# Patient Record
Sex: Female | Born: 1990 | Race: Black or African American | Hispanic: No | Marital: Single | Smoking: Former smoker
Health system: Southern US, Community
[De-identification: ages and names within clinical notes are randomized; demographics above are authoritative.]

## PROBLEM LIST (undated history)

## (undated) DIAGNOSIS — I10 Essential (primary) hypertension: Secondary | ICD-10-CM

## (undated) DIAGNOSIS — J45909 Unspecified asthma, uncomplicated: Secondary | ICD-10-CM

## (undated) DIAGNOSIS — F419 Anxiety disorder, unspecified: Secondary | ICD-10-CM

## (undated) DIAGNOSIS — A64 Unspecified sexually transmitted disease: Secondary | ICD-10-CM

## (undated) HISTORY — DX: Anxiety disorder, unspecified: F41.9

## (undated) HISTORY — DX: Unspecified sexually transmitted disease: A64

---
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
## 2011-03-13 LAB — POCT RAPID STREP A: Streptococcus, Group A Screen (Direct): NEGATIVE

## 2013-10-31 ENCOUNTER — Other Ambulatory Visit: Payer: Self-pay | Admitting: Women's Health

## 2013-11-02 ENCOUNTER — Other Ambulatory Visit (HOSPITAL_COMMUNITY)
Admission: RE | Admit: 2013-11-02 | Discharge: 2013-11-02 | Disposition: A | Discharge status: Home / Self Care | Payer: PRIVATE HEALTH INSURANCE | Source: Ambulatory Visit | Attending: Gynecology | Admitting: Gynecology

## 2013-11-02 ENCOUNTER — Encounter: Payer: Self-pay | Admitting: Women's Health

## 2013-11-02 ENCOUNTER — Ambulatory Visit (INDEPENDENT_AMBULATORY_CARE_PROVIDER_SITE_OTHER): Payer: PRIVATE HEALTH INSURANCE | Admitting: Women's Health

## 2013-11-02 VITALS — BP 120/78 | Ht 68.0 in | Wt 223.0 lb

## 2013-11-02 DIAGNOSIS — A5901 Trichomonal vulvovaginitis: Secondary | ICD-10-CM | POA: Insufficient documentation

## 2013-11-02 DIAGNOSIS — Z113 Encounter for screening for infections with a predominantly sexual mode of transmission: Secondary | ICD-10-CM

## 2013-11-02 DIAGNOSIS — A599 Trichomoniasis, unspecified: Secondary | ICD-10-CM

## 2013-11-02 DIAGNOSIS — A499 Bacterial infection, unspecified: Secondary | ICD-10-CM

## 2013-11-02 DIAGNOSIS — Z01419 Encounter for gynecological examination (general) (routine) without abnormal findings: Secondary | ICD-10-CM

## 2013-11-02 DIAGNOSIS — N760 Acute vaginitis: Secondary | ICD-10-CM

## 2013-11-02 DIAGNOSIS — E669 Obesity, unspecified: Secondary | ICD-10-CM | POA: Insufficient documentation

## 2013-11-02 DIAGNOSIS — B9689 Other specified bacterial agents as the cause of diseases classified elsewhere: Secondary | ICD-10-CM

## 2013-11-02 DIAGNOSIS — N898 Other specified noninflammatory disorders of vagina: Secondary | ICD-10-CM

## 2013-11-02 DIAGNOSIS — Z23 Encounter for immunization: Secondary | ICD-10-CM

## 2013-11-02 LAB — CBC WITH DIFFERENTIAL/PLATELET
Basophils Absolute: 0 10*3/uL (ref 0.0–0.1)
Basophils Relative: 0 % (ref 0–1)
Eosinophils Absolute: 0 10*3/uL (ref 0.0–0.7)
Eosinophils Relative: 1 % (ref 0–5)
HCT: 37.5 % (ref 36.0–46.0)
Hemoglobin: 12.6 g/dL (ref 12.0–15.0)
Lymphocytes Relative: 44 % (ref 12–46)
Lymphs Abs: 1.7 10*3/uL (ref 0.7–4.0)
MCH: 27.3 pg (ref 26.0–34.0)
MCHC: 33.6 g/dL (ref 30.0–36.0)
MCV: 81.3 fL (ref 78.0–100.0)
Monocytes Absolute: 0.4 10*3/uL (ref 0.1–1.0)
Monocytes Relative: 9 % (ref 3–12)
Neutro Abs: 1.8 10*3/uL (ref 1.7–7.7)
Neutrophils Relative %: 46 % (ref 43–77)
Platelets: 345 10*3/uL (ref 150–400)
RBC: 4.61 MIL/uL (ref 3.87–5.11)
RDW: 13.8 % (ref 11.5–15.5)
WBC: 3.9 10*3/uL — ABNORMAL LOW (ref 4.0–10.5)

## 2013-11-02 LAB — WET PREP FOR TRICH, YEAST, CLUE: Yeast Wet Prep HPF POC: NONE SEEN

## 2013-11-02 LAB — RPR

## 2013-11-02 MED ORDER — METRONIDAZOLE 500 MG PO TABS: ORAL_TABLET | ORAL | Status: DC

## 2013-11-02 NOTE — Patient Instructions (Signed)

## 2013-11-02 NOTE — Progress Notes (Signed)
Crystal Barton 02/16/1991 [id number]    History:    Presents for annual exam.   Monthly cycles, condoms for contraception, recent breakup with unfaithful partner. Not sexually active. Has not received gardasil. History of Chlamydia 2 years ago, had negative test of cure  Past medical history, past surgical history, family history and social history were all reviewed and documented in the EPIC chart. 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   ROS:  A  12 point ROS was performed and pertinent positives and negatives are included.  Exam:  Filed Vitals:   11/02/13 1119  BP: 120/78    General appearance:  Normal Thyroid:  Symmetrical, normal in size, without palpable masses or nodularity. Respiratory  Auscultation:  Clear without wheezing or rhonchi Cardiovascular  Auscultation:  Regular rate, without rubs, murmurs or gallops  Edema/varicosities:  Not grossly evident Abdominal  Soft,nontender, without masses, guarding or rebound.  Liver/spleen:  No organomegaly noted  Hernia:  None appreciated  Skin  Inspection:  Grossly normal   Breasts: Examined lying and sitting.     Right: Without masses, retractions, discharge or axillary adenopathy.     Left: Without masses, retractions, discharge or axillary adenopathy. Gentitourinary   Inguinal/mons:  Normal without inguinal adenopathy  External genitalia:  Normal  BUS/Urethra/Skene's glands:  Normal  Vagina:  Bloody discharge with odor. Wet prep positive for amines, Trichomonas, clues, TNTC bacteria  Cervix:  Normal  Uterus:   normal in size, shape and contour.  Midline and mobile  Adnexa/parametria:     Rt: Without masses or tenderness.   Lt: Without masses or tenderness.  Anus and perineum: Normal   Assessment/Plan:  22 y.o.SBF G0  for annual exam.     Trichomonas STD screening Contraception management Gardasil Obesity  Plan: Flagyl 2 g by mouth x1 dose, alcohol precautions reviewed, informed past partner for treatment.  Contraception options reviewed will try Nexplanon, information given and reviewed risk for irregular bleeding, will check coverage, return to office for Dr. Lily PeerFernandez to place with next cycle. Gardasil information given and reviewed, first given, return to office in 2 and 6 months to complete series. Reviewed importance of condoms if sexually active. SBE's, increase regular exercise and decrease calories for weight loss, MVI daily encouraged. CBC, UA, Pap, GC/Chlamydia, HIV, hep B, C., RPR.  Note: This dictation was prepared with Dragon/digital dictation.  Any transcriptional errors that result are unintentional. Harrington Challengerancy J Young Phys[facility] LLCWHNP, 12:00 PM 11/02/2013

## 2013-11-03 ENCOUNTER — Other Ambulatory Visit: Payer: Self-pay | Admitting: Women's Health

## 2013-11-03 LAB — HIV ANTIBODY (ROUTINE TESTING W REFLEX): HIV 1&2 Ab, 4th Generation: NONREACTIVE

## 2013-11-03 LAB — HEPATITIS B SURFACE ANTIGEN: Hepatitis B Surface Ag: NEGATIVE

## 2013-11-03 LAB — GC/CHLAMYDIA PROBE AMP
CT Probe RNA: POSITIVE — AB
GC Probe RNA: NEGATIVE

## 2013-11-03 LAB — HEPATITIS C ANTIBODY: HCV Ab: NEGATIVE

## 2013-11-03 MED ORDER — AZITHROMYCIN 500 MG PO TABS: 1000.0000 mg | ORAL_TABLET | Freq: Every day | ORAL | Status: DC

## 2013-11-24 ENCOUNTER — Encounter: Payer: Self-pay | Admitting: Women's Health

## 2013-11-24 ENCOUNTER — Telehealth: Payer: Self-pay | Admitting: Gynecology

## 2013-11-24 ENCOUNTER — Other Ambulatory Visit: Payer: Self-pay | Admitting: Women's Health

## 2013-11-24 ENCOUNTER — Telehealth: Payer: Self-pay | Admitting: Women's Health

## 2013-11-24 ENCOUNTER — Ambulatory Visit (INDEPENDENT_AMBULATORY_CARE_PROVIDER_SITE_OTHER): Payer: PRIVATE HEALTH INSURANCE | Admitting: Women's Health

## 2013-11-24 DIAGNOSIS — Z113 Encounter for screening for infections with a predominantly sexual mode of transmission: Secondary | ICD-10-CM

## 2013-11-24 DIAGNOSIS — IMO0001 Reserved for inherently not codable concepts without codable children: Secondary | ICD-10-CM

## 2013-11-24 MED ORDER — NORGESTIMATE-ETH ESTRADIOL 0.25-35 MG-MCG PO TABS: 1.0000 | ORAL_TABLET | Freq: Every day | ORAL | Status: DC

## 2013-11-24 NOTE — Patient Instructions (Signed)
Etonogestrel implant What is this medicine? ETONOGESTREL (et oh noe JES trel) is a contraceptive (birth control) device. It is used to prevent pregnancy. It can be used for up to 3 years. This medicine may be used for other purposes; ask your health care provider or pharmacist if you have questions. COMMON BRAND NAME(S): Implanon, Nexplanon  What should I tell my health care provider before I take this medicine? They need to know if you have any of these conditions: -abnormal vaginal bleeding -blood vessel disease or blood clots -cancer of the breast, cervix, or liver -depression -diabetes -gallbladder disease -headaches -heart disease or recent heart attack -high blood pressure -high cholesterol -kidney disease -liver disease -renal disease -seizures -tobacco smoker -an unusual or allergic reaction to etonogestrel, other hormones, anesthetics or antiseptics, medicines, foods, dyes, or preservatives -pregnant or trying to get pregnant -breast-feeding How should I use this medicine? This device is inserted just under the skin on the inner side of your upper arm by a health care professional. Talk to your pediatrician regarding the use of this medicine in children. Special care may be needed. Overdosage: If you think you've taken too much of this medicine contact a poison control center or emergency room at once. Overdosage: If you think you have taken too much of this medicine contact a poison control center or emergency room at once. NOTE: This medicine is only for you. Do not share this medicine with others. What if I miss a dose? This does not apply. What may interact with this medicine? Do not take this medicine with any of the following medications: -amprenavir -bosentan -fosamprenavir This medicine may also interact with the following medications: -barbiturate medicines for inducing sleep or treating seizures -certain medicines for fungal infections like ketoconazole and  itraconazole -griseofulvin -medicines to treat seizures like carbamazepine, felbamate, oxcarbazepine, phenytoin, topiramate -modafinil -phenylbutazone -rifampin -some medicines to treat HIV infection like atazanavir, indinavir, lopinavir, nelfinavir, tipranavir, ritonavir -St. John's wort This list may not describe all possible interactions. Give your health care provider a list of all the medicines, herbs, non-prescription drugs, or dietary supplements you use. Also tell them if you smoke, drink alcohol, or use illegal drugs. Some items may interact with your medicine. What should I watch for while using this medicine? This product does not protect you against HIV infection (AIDS) or other sexually transmitted diseases. You should be able to feel the implant by pressing your fingertips over the skin where it was inserted. Tell your doctor if you cannot feel the implant. What side effects may I notice from receiving this medicine? Side effects that you should report to your doctor or health care professional as soon as possible: -allergic reactions like skin rash, itching or hives, swelling of the face, lips, or tongue -breast lumps -changes in vision -confusion, trouble speaking or understanding -dark urine -depressed mood -general ill feeling or flu-like symptoms -light-colored stools -loss of appetite, nausea -right upper belly pain -severe headaches -severe pain, swelling, or tenderness in the abdomen -shortness of breath, chest pain, swelling in a leg -signs of pregnancy -sudden numbness or weakness of the face, arm or leg -trouble walking, dizziness, loss of balance or coordination -unusual vaginal bleeding, discharge -unusually weak or tired -yellowing of the eyes or skin Side effects that usually do not require medical attention (Report these to your doctor or health care professional if they continue or are bothersome.): -acne -breast pain -changes in  weight -cough -fever or chills -headache -irregular menstrual bleeding -itching, burning,   and vaginal discharge -pain or difficulty passing urine -sore throat This list may not describe all possible side effects. Call your doctor for medical advice about side effects. You may report side effects to FDA at 1-800-FDA-1088. Where should I keep my medicine? This drug is given in a hospital or clinic and will not be stored at home. NOTE: This sheet is a summary. It may not cover all possible information. If you have questions about this medicine, talk to your doctor, pharmacist, or health care provider.  2014, Elsevier/Gold Standard. (2011-12-08 15:37:45)  

## 2013-11-24 NOTE — Telephone Encounter (Signed)
Telephone call, reviewed options for contraception, will try a pill, Sprintec prescription, slight risk for blood clots and strokes, start up instructions and importance of condoms reviewed.

## 2013-11-24 NOTE — Telephone Encounter (Signed)
Message copied by Harrington Challenger on Thu Nov 24, 2013  5:58 PM ------      Message from: Carole Civil R      Created: Thu Nov 24, 2013  2:02 PM      Contact: [phone]       Nancy-I gave [name] the benefits and she can't afford it. She asked if you would call her and suggest another form of birth control. Thanks,Wendy ------

## 2013-11-24 NOTE — Progress Notes (Signed)
Patient ID: Crystal Barton, female   DOB: 06/22/1990, 23 y.o.   MRN: [id number]  Presents for test of cure for chlamydia diagnosed on 11/02/2013.  Partner informed. Has abstained since diagnosis. Trichomonas and BV also diagnosed on 5/20. Was compliant with medications and is currently asymptomatic. Denies spotting, unusual discharge. Negative pap, HIV, RPR, Hep B, C. monthly cycle,  Exam: well-appearing External: normal Speculum: normal,  unusual discharge, or odor, GC/Chlamydia culture taken  Test of cure Chlamydia  Contraception management  Plan: Reviewed importance of condom use, MVI encouraged, contraception options reviewed- will check Nexplanon coverage and Dr. Lily Peer will place during next cycle. Reviewed risks of irregular bleeding and weight gain.

## 2013-11-24 NOTE — Telephone Encounter (Signed)
11/24/13-Pt was advised that her [employer] covers the insertion of Nexplanon with 20% coinsurance once $900.00 deductible met. She has not met the deductible at all. Her out of pocket cost would be $1044.40 which she cannot afford. [place] to call pt with other contraceptive options/wl

## 2013-11-25 LAB — GC/CHLAMYDIA PROBE AMP
CT Probe RNA: NEGATIVE
GC Probe RNA: NEGATIVE

## 2014-04-17 ENCOUNTER — Encounter: Payer: Self-pay | Admitting: Women's Health

## 2014-04-20 ENCOUNTER — Encounter: Payer: Self-pay | Admitting: Women's Health

## 2014-04-20 ENCOUNTER — Ambulatory Visit (INDEPENDENT_AMBULATORY_CARE_PROVIDER_SITE_OTHER): Payer: PRIVATE HEALTH INSURANCE | Admitting: Women's Health

## 2014-04-20 VITALS — BP 136/94 | Ht 68.0 in | Wt 222.0 lb

## 2014-04-20 DIAGNOSIS — Z113 Encounter for screening for infections with a predominantly sexual mode of transmission: Secondary | ICD-10-CM

## 2014-04-20 NOTE — Progress Notes (Signed)
Presents for STD screen. New partner 2 months. Condoms every time. Reports condoms have broke. Positive for Chlamydia in May with negative TOC. LMP Oct 10. Stopped taking sprintec in August due to nausea and "not feeling right". Denies vaginal discharge/odor, urinary symptoms, or abdominal pain.   Exam: Well appearing. External genitalia WNLs. Speculum exam with scant discharge. No CMT or adnexal fullness. GC/Chlamydia culture taken  STD Screen Contraceptive Counseling  Plan: Reviewed contraception options-declined. Discussed importance of condoms every time until permanent partner. HIV, RPR, Hep B&C, CG/Chlamydia. Second gardasil given, return to office in 4 months for third and final.

## 2014-04-21 LAB — GC/CHLAMYDIA PROBE AMP
CT PROBE, AMP APTIMA: NEGATIVE
GC Probe RNA: NEGATIVE

## 2014-04-21 LAB — HEPATITIS B SURFACE ANTIGEN: Hepatitis B Surface Ag: NEGATIVE

## 2014-04-21 LAB — HIV ANTIBODY (ROUTINE TESTING W REFLEX): HIV 1&2 Ab, 4th Generation: NONREACTIVE

## 2014-04-21 LAB — RPR

## 2014-04-21 LAB — HEPATITIS C ANTIBODY: HCV AB: NEGATIVE

## 2014-11-09 ENCOUNTER — Ambulatory Visit (INDEPENDENT_AMBULATORY_CARE_PROVIDER_SITE_OTHER): Payer: PRIVATE HEALTH INSURANCE | Admitting: Women's Health

## 2014-11-09 ENCOUNTER — Encounter: Payer: Self-pay | Admitting: Women's Health

## 2014-11-09 VITALS — BP 116/72 | Ht 68.0 in | Wt 240.0 lb

## 2014-11-09 DIAGNOSIS — A499 Bacterial infection, unspecified: Secondary | ICD-10-CM | POA: Diagnosis not present

## 2014-11-09 DIAGNOSIS — N760 Acute vaginitis: Secondary | ICD-10-CM

## 2014-11-09 DIAGNOSIS — B373 Candidiasis of vulva and vagina: Secondary | ICD-10-CM | POA: Diagnosis not present

## 2014-11-09 DIAGNOSIS — N898 Other specified noninflammatory disorders of vagina: Secondary | ICD-10-CM | POA: Diagnosis not present

## 2014-11-09 DIAGNOSIS — Z01419 Encounter for gynecological examination (general) (routine) without abnormal findings: Secondary | ICD-10-CM

## 2014-11-09 DIAGNOSIS — B9689 Other specified bacterial agents as the cause of diseases classified elsewhere: Secondary | ICD-10-CM

## 2014-11-09 DIAGNOSIS — B3731 Acute candidiasis of vulva and vagina: Secondary | ICD-10-CM

## 2014-11-09 DIAGNOSIS — Z23 Encounter for immunization: Secondary | ICD-10-CM | POA: Diagnosis not present

## 2014-11-09 LAB — CBC WITH DIFFERENTIAL/PLATELET
Basophils Absolute: 0 10*3/uL (ref 0.0–0.1)
Basophils Relative: 0 % (ref 0–1)
EOS PCT: 2 % (ref 0–5)
Eosinophils Absolute: 0.1 10*3/uL (ref 0.0–0.7)
HCT: 40.5 % (ref 36.0–46.0)
HEMOGLOBIN: 13.5 g/dL (ref 12.0–15.0)
Lymphocytes Relative: 44 % (ref 12–46)
Lymphs Abs: 2.8 10*3/uL (ref 0.7–4.0)
MCH: 28.4 pg (ref 26.0–34.0)
MCHC: 33.3 g/dL (ref 30.0–36.0)
MCV: 85.1 fL (ref 78.0–100.0)
MONO ABS: 0.6 10*3/uL (ref 0.1–1.0)
MONOS PCT: 9 % (ref 3–12)
MPV: 9.4 fL (ref 8.6–12.4)
Neutro Abs: 2.9 10*3/uL (ref 1.7–7.7)
Neutrophils Relative %: 45 % (ref 43–77)
Platelets: 318 10*3/uL (ref 150–400)
RBC: 4.76 MIL/uL (ref 3.87–5.11)
RDW: 13.5 % (ref 11.5–15.5)
WBC: 6.4 10*3/uL (ref 4.0–10.5)

## 2014-11-09 LAB — WET PREP FOR TRICH, YEAST, CLUE: TRICH WET PREP: NONE SEEN

## 2014-11-09 MED ORDER — METRONIDAZOLE 500 MG PO TABS: 500.0000 mg | ORAL_TABLET | Freq: Two times a day (BID) | ORAL | Status: DC

## 2014-11-09 MED ORDER — FLUCONAZOLE 150 MG PO TABS: 150.0000 mg | ORAL_TABLET | Freq: Once | ORAL | Status: DC

## 2014-11-09 NOTE — Addendum Note (Signed)
Addended by: Dayna BarkerGARDNER, Shrihaan Porzio K on: 11/09/2014 04:03 PM   Modules accepted: Orders

## 2014-11-09 NOTE — Patient Instructions (Signed)

## 2014-11-09 NOTE — Addendum Note (Signed)
Addended by: Dayna BarkerGARDNER, KIMBERLY K on: 11/09/2014 10:37 AM   Modules accepted: Orders

## 2014-11-09 NOTE — Addendum Note (Signed)
Addended by: Rushie GoltzSPANGLER, Candid Bovey on: 11/09/2014 02:02 PM   Modules accepted: Orders

## 2014-11-09 NOTE — Addendum Note (Signed)
Addended by: Rushie GoltzSPANGLER, Printice Hellmer on: 11/09/2014 10:33 AM   Modules accepted: Orders

## 2014-11-09 NOTE — Progress Notes (Signed)
Crystal Barton 01/26/1991 [id number]    History:    Presents for annual exam.  Regular monthly cycle/condoms/same partner. Negative STD screen 04/2014. Received 1 gardasil. Smokes one light cigar daily.  Past medical history, past surgical history, family history and social history were all reviewed and documented in the EPIC chart. 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  ROS:  A ROS was performed and pertinent positives and negatives are included.  Exam:  Filed Vitals:   11/09/14 0858  BP: 116/72    General appearance:  Normal Thyroid:  Symmetrical, normal in size, without palpable masses or nodularity. Respiratory  Auscultation:  Clear without wheezing or rhonchi Cardiovascular  Auscultation:  Regular rate, without rubs, murmurs or gallops  Edema/varicosities:  Not grossly evident Abdominal  Soft,nontender, without masses, guarding or rebound.  Liver/spleen:  No organomegaly noted  Hernia:  None appreciated  Skin  Inspection:  Grossly normal   Breasts: Examined lying and sitting.     Right: Without masses, retractions, discharge or axillary adenopathy.     Left: Without masses, retractions, discharge or axillary adenopathy. Gentitourinary   Inguinal/mons:  Normal without inguinal adenopathy  External genitalia:  Normal  BUS/Urethra/Skene's glands:  Normal  Vagina:  Moderate discharge with odor, wet prep positive for few yeast, amines, clues, TNTC bacteria  Cervix:  Normal  Uterus:   normal in size, shape and contour.  Midline and mobile  Adnexa/parametria:     Rt: Without masses or tenderness.   Lt: Without masses or tenderness.  Anus and perineum: Normal    Assessment/Plan:  24 y.o. SBF G0  for annual exam with no complaints.  Monthly cycle/condoms Bacteria vaginosis Yeast Obesity  Plan: Contraception options reviewed, would like Nexplanon not covered by insurance, will check with [facility] or health department for placement. Condoms in the  meanwhile declines OCPs. SBE's, exercise, calcium rich diet, MVI daily, decreasing calories for weight loss encouraged. Reviewed importance of no smoking. CBC, glucose, UA, Pap normal 2015, new screening guidelines reviewed. Flagyl 500 twice daily for 7 days, alcohol precautions reviewed, Diflucan 150 by mouth times one dose with refill. Declines STD screen. Instructed to call if no relief of discharge. Second gardasil given, instructed to return to office in 4 months for third .   Harrington ChallengerYOUNG,NANCY J [facility] CenterWHNP, 9:34 AM 11/09/2014

## 2014-11-10 LAB — URINALYSIS W MICROSCOPIC + REFLEX CULTURE
BILIRUBIN URINE: NEGATIVE
Casts: NONE SEEN
Crystals: NONE SEEN
Glucose, UA: NEGATIVE mg/dL
Hgb urine dipstick: NEGATIVE
KETONES UR: NEGATIVE mg/dL
Nitrite: NEGATIVE
PROTEIN: NEGATIVE mg/dL
Specific Gravity, Urine: 1.027 (ref 1.005–1.030)
Urobilinogen, UA: 0.2 mg/dL (ref 0.0–1.0)
pH: 5.5 (ref 5.0–8.0)

## 2014-11-10 LAB — GLUCOSE, RANDOM: Glucose, Bld: 84 mg/dL (ref 70–99)

## 2014-11-11 LAB — URINE CULTURE: Colony Count: >=100000

## 2015-01-30 ENCOUNTER — Encounter: Payer: Self-pay | Admitting: Women's Health

## 2015-01-30 ENCOUNTER — Ambulatory Visit (INDEPENDENT_AMBULATORY_CARE_PROVIDER_SITE_OTHER): Payer: PRIVATE HEALTH INSURANCE | Admitting: Women's Health

## 2015-01-30 VITALS — BP 124/80 | Ht 68.0 in | Wt 240.0 lb

## 2015-01-30 DIAGNOSIS — Z23 Encounter for immunization: Secondary | ICD-10-CM | POA: Diagnosis not present

## 2015-01-30 DIAGNOSIS — R350 Frequency of micturition: Secondary | ICD-10-CM | POA: Diagnosis not present

## 2015-01-30 DIAGNOSIS — N898 Other specified noninflammatory disorders of vagina: Secondary | ICD-10-CM

## 2015-01-30 DIAGNOSIS — B373 Candidiasis of vulva and vagina: Secondary | ICD-10-CM | POA: Diagnosis not present

## 2015-01-30 DIAGNOSIS — B3731 Acute candidiasis of vulva and vagina: Secondary | ICD-10-CM

## 2015-01-30 LAB — WET PREP FOR TRICH, YEAST, CLUE
CLUE CELLS WET PREP: NONE SEEN
Trich, Wet Prep: NONE SEEN

## 2015-01-30 LAB — URINALYSIS W MICROSCOPIC + REFLEX CULTURE
Bilirubin Urine: NEGATIVE
Crystals: NONE SEEN [HPF]
Glucose, UA: NEGATIVE
Hgb urine dipstick: NEGATIVE
Ketones, ur: NEGATIVE
Nitrite: NEGATIVE
PROTEIN: NEGATIVE
RBC / HPF: NONE SEEN RBC/HPF (ref <=2)
Specific Gravity, Urine: 1.02 (ref 1.001–1.035)
pH: 7 (ref 5.0–8.0)

## 2015-01-30 MED ORDER — FLUCONAZOLE 150 MG PO TABS: 150.0000 mg | ORAL_TABLET | Freq: Once | ORAL | Status: DC

## 2015-01-30 NOTE — Patient Instructions (Signed)

## 2015-01-30 NOTE — Progress Notes (Signed)
Patient ID: Crystal Barton, female   DOB: 11-28-1990, 24 y.o.   MRN: [id number] Resents with complaint of vaginal discharge with itching and increased urinary frequency for the past week. Has had several yeast infections in the past year. Past partner unfaithful, reports condom use consistently. Monthly cycle. Denies abdominal pain, fever or pain or burning at end of stream of urination.  Exam: Appears well. External genitalia mild erythema, speculum exam scant white discharge, GC/Chlamydia culture taken. Wet prep positive for yeast. Bimanual no CMT or adnexal tenderness. UA: Trace leukocytes, 0-5 WBCs, few bacteria.  Yeast vaginitis  Plan: Diflucan 150 by mouth today and repeat in 3 days if needed. Yeast prevention discussed. Instructed to call if continued problems or if no relief.  GC/Chlamydia culture pending. Denies need for HIV, hepatitis or RPR. Contraception options reviewed and denies need will continue abstinence or if active will use condoms. Urine culture pending. Third gardasil given.

## 2015-01-31 LAB — GC/CHLAMYDIA PROBE AMP
CT Probe RNA: NEGATIVE
GC Probe RNA: NEGATIVE

## 2015-01-31 LAB — URINE CULTURE

## 2015-03-07 ENCOUNTER — Ambulatory Visit (INDEPENDENT_AMBULATORY_CARE_PROVIDER_SITE_OTHER): Payer: PRIVATE HEALTH INSURANCE | Admitting: Women's Health

## 2015-03-07 ENCOUNTER — Encounter: Payer: Self-pay | Admitting: Women's Health

## 2015-03-07 VITALS — BP 124/80 | Ht 68.0 in | Wt 242.0 lb

## 2015-03-07 DIAGNOSIS — N898 Other specified noninflammatory disorders of vagina: Secondary | ICD-10-CM

## 2015-03-07 DIAGNOSIS — B373 Candidiasis of vulva and vagina: Secondary | ICD-10-CM | POA: Diagnosis not present

## 2015-03-07 DIAGNOSIS — N760 Acute vaginitis: Secondary | ICD-10-CM | POA: Diagnosis not present

## 2015-03-07 DIAGNOSIS — A499 Bacterial infection, unspecified: Secondary | ICD-10-CM | POA: Diagnosis not present

## 2015-03-07 DIAGNOSIS — B3731 Acute candidiasis of vulva and vagina: Secondary | ICD-10-CM

## 2015-03-07 DIAGNOSIS — B9689 Other specified bacterial agents as the cause of diseases classified elsewhere: Secondary | ICD-10-CM

## 2015-03-07 LAB — WET PREP FOR TRICH, YEAST, CLUE: Trich, Wet Prep: NONE SEEN

## 2015-03-07 MED ORDER — FLUCONAZOLE 150 MG PO TABS: 150.0000 mg | ORAL_TABLET | Freq: Once | ORAL | Status: DC

## 2015-03-07 MED ORDER — METRONIDAZOLE 500 MG PO TABS: 500.0000 mg | ORAL_TABLET | Freq: Two times a day (BID) | ORAL | Status: DC

## 2015-03-07 NOTE — Progress Notes (Signed)
Patient ID: Crystal Barton, female   DOB: February 09, 1991, 24 y.o.   MRN: [id number] Presents with complaint of increased vaginal discharge with odor, requesting STD screen/ partner unfaithful. Regular monthly cycle/condoms consistently. Denies abdominal pain, urinary symptoms, fever.  Exam: Appears well. Abdomen soft nontender. External genitalia within normal limits, speculum exam moderate amount of a white adherent discharge noted wet prep positive for yeast, clues, TNTC bacteria. GC/Chlamydia culture taken. Bimanual no CMT or adnexal tenderness.  Yeast vaginitis Bacteria vaginosis STD screen  Plan: Flagyl 500 twice daily for 7 days, alcohol precautions reviewed. Diflucan 150 by mouth times one dose with refill. Instructed to call if no relief. GC/immediate culture pending. Declined HIV, hep B, C, RPR. Reviewed importance of condoms if sexually active. Contraception options reviewed and declined.

## 2015-03-07 NOTE — Patient Instructions (Signed)
Monilial Vaginitis Vaginitis in a soreness, swelling and redness (inflammation) of the vagina and vulva. Monilial vaginitis is not a sexually transmitted infection. CAUSES  Yeast vaginitis is caused by yeast (candida) that is normally found in your vagina. With a yeast infection, the candida has overgrown in number to a point that upsets the chemical balance. SYMPTOMS   White, thick vaginal discharge.  Swelling, itching, redness and irritation of the vagina and possibly the lips of the vagina (vulva).  Burning or painful urination.  Painful intercourse. DIAGNOSIS  Things that may contribute to monilial vaginitis are:  Postmenopausal and virginal states.  Pregnancy.  Infections.  Being tired, sick or stressed, especially if you had monilial vaginitis in the past.  Diabetes. Good control will help lower the chance.  Birth control pills.  Tight fitting garments.  Using bubble bath, feminine sprays, douches or deodorant tampons.  Taking certain medications that kill germs (antibiotics).  Sporadic recurrence can occur if you become ill. TREATMENT  Your caregiver will give you medication.  There are several kinds of anti monilial vaginal creams and suppositories specific for monilial vaginitis. For recurrent yeast infections, use a suppository or cream in the vagina 2 times a week, or as directed.  Anti-monilial or steroid cream for the itching or irritation of the vulva may also be used. Get your caregiver's permission.  Painting the vagina with methylene blue solution may help if the monilial cream does not work.  Eating yogurt may help prevent monilial vaginitis. HOME CARE INSTRUCTIONS   Finish all medication as prescribed.  Do not have sex until treatment is completed or after your caregiver tells you it is okay.  Take warm sitz baths.  Do not douche.  Do not use tampons, especially scented ones.  Wear cotton underwear.  Avoid tight pants and panty  hose.  Tell your sexual partner that you have a yeast infection. They should go to their caregiver if they have symptoms such as mild rash or itching.  Your sexual partner should be treated as well if your infection is difficult to eliminate.  Practice safer sex. Use condoms.  Some vaginal medications cause latex condoms to fail. Vaginal medications that harm condoms are:  Cleocin cream.  Butoconazole (Femstat).  Terconazole (Terazol) vaginal suppository.  Miconazole (Monistat) (may be purchased over the counter). SEEK MEDICAL CARE IF:   You have a temperature by mouth above 102 F (38.9 C).  The infection is getting worse after 2 days of treatment.  The infection is not getting better after 3 days of treatment.  You develop blisters in or around your vagina.  You develop vaginal bleeding, and it is not your menstrual period.  You have pain when you urinate.  You develop intestinal problems.  You have pain with sexual intercourse. Document Released: 03/12/2005 Document Revised: 08/25/2011 Document Reviewed: 11/24/2008 ExitCare Patient Information 2015 [employer]. This information is not intended to replace advice given to you by your health care provider. Make sure you discuss any questions you have with your health care provider. Bacterial Vaginosis Bacterial vaginosis is an infection of the vagina. It happens when too many of certain germs (bacteria) grow in the vagina. HOME CARE  Take your medicine as told by your doctor.  Finish your medicine even if you start to feel better.  Do not have sex until you finish your medicine and are better.  Tell your sex partner that you have an infection. They should see their doctor for treatment.  Practice safe sex.   Use condoms. Have only one sex partner. GET HELP IF:  You are not getting better after 3 days of treatment.  You have more grey fluid (discharge) coming from your vagina than before.  You have more  pain than before.  You have a fever. MAKE SURE YOU:   Understand these instructions.  Will watch your condition.  Will get help right away if you are not doing well or get worse. Document Released: 03/11/2008 Document Revised: 03/23/2013 Document Reviewed: 01/12/2013 ExitCare Patient Information 2015 [employer]. This information is not intended to replace advice given to you by your health care provider. Make sure you discuss any questions you have with your health care provider.  

## 2015-03-08 LAB — GC/CHLAMYDIA PROBE AMP
CT Probe RNA: NEGATIVE
GC Probe RNA: NEGATIVE

## 2015-11-27 ENCOUNTER — Encounter: Payer: PRIVATE HEALTH INSURANCE | Admitting: Women's Health

## 2015-11-28 ENCOUNTER — Encounter: Payer: PRIVATE HEALTH INSURANCE | Admitting: Women's Health

## 2015-12-12 ENCOUNTER — Ambulatory Visit (INDEPENDENT_AMBULATORY_CARE_PROVIDER_SITE_OTHER): Payer: PRIVATE HEALTH INSURANCE | Admitting: Women's Health

## 2015-12-12 ENCOUNTER — Encounter: Payer: Self-pay | Admitting: Women's Health

## 2015-12-12 VITALS — BP 122/78 | Wt 252.0 lb

## 2015-12-12 DIAGNOSIS — L089 Local infection of the skin and subcutaneous tissue, unspecified: Secondary | ICD-10-CM

## 2015-12-12 DIAGNOSIS — Z01419 Encounter for gynecological examination (general) (routine) without abnormal findings: Secondary | ICD-10-CM

## 2015-12-12 DIAGNOSIS — N946 Dysmenorrhea, unspecified: Secondary | ICD-10-CM

## 2015-12-12 DIAGNOSIS — Z113 Encounter for screening for infections with a predominantly sexual mode of transmission: Secondary | ICD-10-CM

## 2015-12-12 LAB — CBC WITH DIFFERENTIAL/PLATELET
Basophils Absolute: 0 cells/uL (ref 0–200)
Basophils Relative: 0 %
EOS PCT: 1 %
Eosinophils Absolute: 54 cells/uL (ref 15–500)
HCT: 38.6 % (ref 35.0–45.0)
HEMOGLOBIN: 12.8 g/dL (ref 11.7–15.5)
LYMPHS ABS: 2052 {cells}/uL (ref 850–3900)
Lymphocytes Relative: 38 %
MCH: 27.4 pg (ref 27.0–33.0)
MCHC: 33.2 g/dL (ref 32.0–36.0)
MCV: 82.7 fL (ref 80.0–100.0)
MONOS PCT: 7 %
MPV: 10.5 fL (ref 7.5–12.5)
Monocytes Absolute: 378 cells/uL (ref 200–950)
NEUTROS ABS: 2916 {cells}/uL (ref 1500–7800)
NEUTROS PCT: 54 %
PLATELETS: 360 10*3/uL (ref 140–400)
RBC: 4.67 MIL/uL (ref 3.80–5.10)
RDW: 13.6 % (ref 11.0–15.0)
WBC: 5.4 10*3/uL (ref 3.8–10.8)

## 2015-12-12 MED ORDER — CEPHALEXIN 500 MG PO CAPS: 500.0000 mg | ORAL_CAPSULE | Freq: Four times a day (QID) | ORAL | Status: DC

## 2015-12-12 MED ORDER — FLUCONAZOLE 150 MG PO TABS: 150.0000 mg | ORAL_TABLET | Freq: Once | ORAL | Status: DC

## 2015-12-12 MED ORDER — IBUPROFEN 600 MG PO TABS: 600.0000 mg | ORAL_TABLET | Freq: Three times a day (TID) | ORAL | Status: DC | PRN

## 2015-12-12 NOTE — Progress Notes (Signed)
Crystal Barton 02/27/1991 [id number]   History:    Presents for annual exam with complaint of increased cramping during periods and persistent skin lesion under left arm.  Monthly 7day cycles.  New partner, condoms.  Gardasil series completed.  Negative Pap 2015.  Reports weight gain, recently started walking for exercise.  Eats fruits and vegetables regularly, trying to eliminate sugary drinks from diet.  Past medical history, past surgical history, family history and social history were all reviewed and documented in the EPIC chart.  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  Quit smoking cigars within past year.    ROS:  A ROS was performed and pertinent positives and negatives are included.  Exam:  Filed Vitals:   12/12/15 1100  BP: 122/78    General appearance:  Normal Thyroid:  Symmetrical, normal in size, without palpable masses or nodularity. Respiratory  Auscultation:  Clear without wheezing or rhonchi Cardiovascular  Auscultation:  Regular rate, without rubs, murmurs or gallops  Edema/varicosities:  Not grossly evident Abdominal  Soft,nontender, without masses, guarding or rebound.  Liver/spleen:  No organomegaly noted  Hernia:  None appreciated  Skin  Inspection:  Grossly normal.  Raised red lesion left axilla.   Breasts: Examined lying and sitting.     Right: Without masses, retractions, discharge or axillary adenopathy.     Left: Without masses, retractions, discharge or axillary adenopathy. Gentitourinary   Inguinal/mons:  Normal without inguinal adenopathy  External genitalia:  Normal  BUS/Urethra/Skene's glands:  Normal  Vagina:  Normal, no erythema or discharge present.  Cervix:  Normal  Uterus:  Normal in size, shape and contour.  Midline and mobile  Adnexa/parametria:     Rt: Without masses or tenderness.   Lt: Without masses or tenderness.  Anus and perineum: Normal  Digital rectal exam: Normal sphincter tone without palpated masses or  tenderness  Assessment/Plan:  25 y.o. SBF G2P0020  for annual exam with complaints of increased cramping during monthly cycles and skin lesion under left arm.  Monthly cycles with dysmenorrhea Obesity Skin infection left axilla STD Screen  Plan: Transvaginal ultrasound to rule out fibroids.  Prescribed Motrin 600mg  po q8hrs prn for relief of menstrual cramps.  Encouraged healthy diet and regular exercise, discussed gym membership.  Keflex 500mg  QID x5 days prescribed to treat skin lesion. Instructed to call if no relief  Diflucan 150mg  x1dose if yeast infection occurs as result of antibiotics.  Continue condoms, patient declines other contraception. SBE's, continue to increase regular exercise, calcium rich diet, MVI daily. CBC, CMP, UA, GC/Chl, Hep C, Hep B, RPR, HIV.   Harrington ChallengerYOUNG,NANCY J WHNP, 11:39 AM 12/12/2015

## 2015-12-12 NOTE — Patient Instructions (Signed)
Health Maintenance, Female Adopting a healthy lifestyle and getting preventive care can go a long way to promote health and wellness. Talk with your health care provider about what schedule of regular examinations is right for you. This is a good chance for you to check in with your provider about disease prevention and staying healthy. In between checkups, there are plenty of things you can do on your own. Experts have done a lot of research about which lifestyle changes and preventive measures are most likely to keep you healthy. Ask your health care provider for more information. WEIGHT AND DIET  Eat a healthy diet  Be sure to include plenty of vegetables, fruits, low-fat dairy products, and lean protein.  Do not eat a lot of foods high in solid fats, added sugars, or salt.  Get regular exercise. This is one of the most important things you can do for your health.  Most adults should exercise for at least 150 minutes each week. The exercise should increase your heart rate and make you sweat (moderate-intensity exercise).  Most adults should also do strengthening exercises at least twice a week. This is in addition to the moderate-intensity exercise.  Maintain a healthy weight  Body mass index (BMI) is a measurement that can be used to identify possible weight problems. It estimates body fat based on height and weight. Your health care provider can help determine your BMI and help you achieve or maintain a healthy weight.  For females 20 years of age and older:   A BMI below 18.5 is considered underweight.  A BMI of 18.5 to 24.9 is normal.  A BMI of 25 to 29.9 is considered overweight.  A BMI of 30 and above is considered obese.  Watch levels of cholesterol and blood lipids  You should start having your blood tested for lipids and cholesterol at 25 years of age, then have this test every 5 years.  You may need to have your cholesterol levels checked more often if:  Your lipid  or cholesterol levels are high.  You are older than 25 years of age.  You are at high risk for heart disease.  CANCER SCREENING   Lung Cancer  Lung cancer screening is recommended for adults 55-80 years old who are at high risk for lung cancer because of a history of smoking.  A yearly low-dose CT scan of the lungs is recommended for people who:  Currently smoke.  Have quit within the past 15 years.  Have at least a 30-pack-year history of smoking. A pack year is smoking an average of one pack of cigarettes a day for 1 year.  Yearly screening should continue until it has been 15 years since you quit.  Yearly screening should stop if you develop a health problem that would prevent you from having lung cancer treatment.  Breast Cancer  Practice breast self-awareness. This means understanding how your breasts normally appear and feel.  It also means doing regular breast self-exams. Let your health care provider know about any changes, no matter how small.  If you are in your 20s or 30s, you should have a clinical breast exam (CBE) by a health care provider every 1-3 years as part of a regular health exam.  If you are 40 or older, have a CBE every year. Also consider having a breast X-ray (mammogram) every year.  If you have a family history of breast cancer, talk to your health care provider about genetic screening.  If you   are at high risk for breast cancer, talk to your health care provider about having an MRI and a mammogram every year.  Breast cancer gene (BRCA) assessment is recommended for women who have family members with BRCA-related cancers. BRCA-related cancers include:  Breast.  Ovarian.  Tubal.  Peritoneal cancers.  Results of the assessment will determine the need for genetic counseling and BRCA1 and BRCA2 testing. Cervical Cancer Your health care provider may recommend that you be screened regularly for cancer of the pelvic organs (ovaries, uterus, and  vagina). This screening involves a pelvic examination, including checking for microscopic changes to the surface of your cervix (Pap test). You may be encouraged to have this screening done every 3 years, beginning at age 21.  For women ages 30-65, health care providers may recommend pelvic exams and Pap testing every 3 years, or they may recommend the Pap and pelvic exam, combined with testing for human papilloma virus (HPV), every 5 years. Some types of HPV increase your risk of cervical cancer. Testing for HPV may also be done on women of any age with unclear Pap test results.  Other health care providers may not recommend any screening for nonpregnant women who are considered low risk for pelvic cancer and who do not have symptoms. Ask your health care provider if a screening pelvic exam is right for you.  If you have had past treatment for cervical cancer or a condition that could lead to cancer, you need Pap tests and screening for cancer for at least 20 years after your treatment. If Pap tests have been discontinued, your risk factors (such as having a new sexual partner) need to be reassessed to determine if screening should resume. Some women have medical problems that increase the chance of getting cervical cancer. In these cases, your health care provider may recommend more frequent screening and Pap tests. Colorectal Cancer  This type of cancer can be detected and often prevented.  Routine colorectal cancer screening usually begins at 25 years of age and continues through 25 years of age.  Your health care provider may recommend screening at an earlier age if you have risk factors for colon cancer.  Your health care provider may also recommend using home test kits to check for hidden blood in the stool.  A small camera at the end of a tube can be used to examine your colon directly (sigmoidoscopy or colonoscopy). This is done to check for the earliest forms of colorectal  cancer.  Routine screening usually begins at age 50.  Direct examination of the colon should be repeated every 5-10 years through 25 years of age. However, you may need to be screened more often if early forms of precancerous polyps or small growths are found. Skin Cancer  Check your skin from head to toe regularly.  Tell your health care provider about any new moles or changes in moles, especially if there is a change in a mole's shape or color.  Also tell your health care provider if you have a mole that is larger than the size of a pencil eraser.  Always use sunscreen. Apply sunscreen liberally and repeatedly throughout the day.  Protect yourself by wearing long sleeves, pants, a wide-brimmed hat, and sunglasses whenever you are outside. HEART DISEASE, DIABETES, AND HIGH BLOOD PRESSURE   High blood pressure causes heart disease and increases the risk of stroke. High blood pressure is more likely to develop in:  People who have blood pressure in the high end   of the normal range (130-139/85-89 mm Hg).  People who are overweight or obese.  People who are African American.  If you are 38-23 years of age, have your blood pressure checked every 3-5 years. If you are 61 years of age or older, have your blood pressure checked every year. You should have your blood pressure measured twice--once when you are at a hospital or clinic, and once when you are not at a hospital or clinic. Record the average of the two measurements. To check your blood pressure when you are not at a hospital or clinic, you can use:  An automated blood pressure machine at a pharmacy.  A home blood pressure monitor.  If you are between 45 years and 39 years old, ask your health care provider if you should take aspirin to prevent strokes.  Have regular diabetes screenings. This involves taking a blood sample to check your fasting blood sugar level.  If you are at a normal weight and have a low risk for diabetes,  have this test once every three years after 25 years of age.  If you are overweight and have a high risk for diabetes, consider being tested at a younger age or more often. PREVENTING INFECTION  Hepatitis B  If you have a higher risk for hepatitis B, you should be screened for this virus. You are considered at high risk for hepatitis B if:  You were born in a country where hepatitis B is common. Ask your health care provider which countries are considered high risk.  Your parents were born in a high-risk country, and you have not been immunized against hepatitis B (hepatitis B vaccine).  You have HIV or AIDS.  You use needles to inject street drugs.  You live with someone who has hepatitis B.  You have had sex with someone who has hepatitis B.  You get hemodialysis treatment.  You take certain medicines for conditions, including cancer, organ transplantation, and autoimmune conditions. Hepatitis C  Blood testing is recommended for:  Everyone born from [birth year] through 1965.  Anyone with known risk factors for hepatitis C. Sexually transmitted infections (STIs)  You should be screened for sexually transmitted infections (STIs) including gonorrhea and chlamydia if:  You are sexually active and are younger than 24 years of age.  You are older than 25 years of age and your health care provider tells you that you are at risk for this type of infection.  Your sexual activity has changed since you were last screened and you are at an increased risk for chlamydia or gonorrhea. Ask your health care provider if you are at risk.  If you do not have HIV, but are at risk, it may be recommended that you take a prescription medicine daily to prevent HIV infection. This is called pre-exposure prophylaxis (PrEP). You are considered at risk if:  You are sexually active and do not regularly use condoms or know the HIV status of your partner(s).  You take drugs by injection.  You are sexually  active with a partner who has HIV. Talk with your health care provider about whether you are at high risk of being infected with HIV. If you choose to begin PrEP, you should first be tested for HIV. You should then be tested every 3 months for as long as you are taking PrEP.  PREGNANCY   If you are premenopausal and you may become pregnant, ask your health care provider about preconception counseling.  If you may  become pregnant, take 400 to 800 micrograms (mcg) of folic acid every day.  If you want to prevent pregnancy, talk to your health care provider about birth control (contraception). OSTEOPOROSIS AND MENOPAUSE   Osteoporosis is a disease in which the bones lose minerals and strength with aging. This can result in serious bone fractures. Your risk for osteoporosis can be identified using a bone density scan.  If you are 61 years of age or older, or if you are at risk for osteoporosis and fractures, ask your health care provider if you should be screened.  Ask your health care provider whether you should take a calcium or vitamin D supplement to lower your risk for osteoporosis.  Menopause may have certain physical symptoms and risks.  Hormone replacement therapy may reduce some of these symptoms and risks. Talk to your health care provider about whether hormone replacement therapy is right for you.  HOME CARE INSTRUCTIONS   Schedule regular health, dental, and eye exams.  Stay current with your immunizations.   Do not use any tobacco products including cigarettes, chewing tobacco, or electronic cigarettes.  If you are pregnant, do not drink alcohol.  If you are breastfeeding, limit how much and how often you drink alcohol.  Limit alcohol intake to no more than 1 drink per day for nonpregnant women. One drink equals 12 ounces of beer, 5 ounces of wine, or 1 ounces of hard liquor.  Do not use street drugs.  Do not share needles.  Ask your health care provider for help if  you need support or information about quitting drugs.  Tell your health care provider if you often feel depressed.  Tell your health care provider if you have ever been abused or do not feel safe at home.   This information is not intended to replace advice given to you by your health care provider. Make sure you discuss any questions you have with your health care provider.   Document Released: 12/16/2010 Document Revised: 06/23/2014 Document Reviewed: 05/04/2013 Elsevier Interactive Patient Education [employer].

## 2015-12-13 LAB — URINALYSIS W MICROSCOPIC + REFLEX CULTURE
BACTERIA UA: NONE SEEN [HPF]
BILIRUBIN URINE: NEGATIVE
CRYSTALS: NONE SEEN [HPF]
Casts: NONE SEEN [LPF]
GLUCOSE, UA: NEGATIVE
KETONES UR: NEGATIVE
LEUKOCYTES UA: NEGATIVE
Nitrite: NEGATIVE
PROTEIN: NEGATIVE
SPECIFIC GRAVITY, URINE: 1.026 (ref 1.001–1.035)
WBC UA: NONE SEEN WBC/HPF (ref <=5)
Yeast: NONE SEEN [HPF]
pH: 6 (ref 5.0–8.0)

## 2015-12-13 LAB — GLUCOSE, RANDOM: GLUCOSE: 80 mg/dL (ref 65–99)

## 2015-12-13 LAB — HIV ANTIBODY (ROUTINE TESTING W REFLEX): HIV 1&2 Ab, 4th Generation: NONREACTIVE

## 2015-12-13 LAB — RPR

## 2015-12-13 LAB — GC/CHLAMYDIA PROBE AMP
CT PROBE, AMP APTIMA: NOT DETECTED
GC PROBE AMP APTIMA: NOT DETECTED

## 2015-12-13 LAB — HEPATITIS B SURFACE ANTIGEN: Hepatitis B Surface Ag: NEGATIVE

## 2015-12-13 LAB — HEPATITIS C ANTIBODY: HCV Ab: NEGATIVE

## 2015-12-14 LAB — URINE CULTURE

## 2016-01-02 ENCOUNTER — Ambulatory Visit (INDEPENDENT_AMBULATORY_CARE_PROVIDER_SITE_OTHER): Payer: PRIVATE HEALTH INSURANCE | Admitting: Women's Health

## 2016-01-02 ENCOUNTER — Encounter: Payer: Self-pay | Admitting: Women's Health

## 2016-01-02 ENCOUNTER — Ambulatory Visit (INDEPENDENT_AMBULATORY_CARE_PROVIDER_SITE_OTHER): Payer: PRIVATE HEALTH INSURANCE

## 2016-01-02 VITALS — BP 118/76

## 2016-01-02 DIAGNOSIS — N946 Dysmenorrhea, unspecified: Secondary | ICD-10-CM

## 2016-01-02 MED ORDER — TRAMADOL HCL 50 MG PO TABS: 50.0000 mg | ORAL_TABLET | Freq: Four times a day (QID) | ORAL | Status: DC | PRN

## 2016-01-02 NOTE — Patient Instructions (Signed)

## 2016-01-02 NOTE — Progress Notes (Signed)
Patient ID: Crystal Barton, female   DOB: 02/22/1991, 25 y.o.   MRN: [id number] Presents for ultrasound. Had a questionable enlarged uterus at annual exam, abdominal obesity  and has had increased dysmenorrhea over the past year. Minimal relief with Motrin. Contraceptives with condoms/ monthly cycle. OCs in the past that caused nausea prefers to continue with condoms. Smokes half pack cigarettes daily.  Exam: Appears well. Ultrasound: T/V anteverted uterus, 83 mm length, homogeneous with prominent endometrium day 3 of menses. Right ovary and left ovary normal. Negative cul-de-sac. No apparent masses seen on right or left  Adnexal.  Dysmenorrhea  Plan: Options reviewed to try a different pill, declines will try Ultram 50 mg 2-3 times daily with menses, if no relief agreeable to try a different OC. Addictive properties of Ultram reviewed.

## 2016-05-20 ENCOUNTER — Encounter: Payer: Self-pay | Admitting: Women's Health

## 2016-05-20 ENCOUNTER — Ambulatory Visit (INDEPENDENT_AMBULATORY_CARE_PROVIDER_SITE_OTHER): Payer: PRIVATE HEALTH INSURANCE | Admitting: Women's Health

## 2016-05-20 VITALS — BP 124/80 | Ht 68.0 in | Wt 242.0 lb

## 2016-05-20 DIAGNOSIS — N760 Acute vaginitis: Secondary | ICD-10-CM

## 2016-05-20 DIAGNOSIS — N898 Other specified noninflammatory disorders of vagina: Secondary | ICD-10-CM

## 2016-05-20 DIAGNOSIS — Z113 Encounter for screening for infections with a predominantly sexual mode of transmission: Secondary | ICD-10-CM | POA: Diagnosis not present

## 2016-05-20 DIAGNOSIS — B9689 Other specified bacterial agents as the cause of diseases classified elsewhere: Secondary | ICD-10-CM | POA: Diagnosis not present

## 2016-05-20 DIAGNOSIS — R350 Frequency of micturition: Secondary | ICD-10-CM | POA: Diagnosis not present

## 2016-05-20 LAB — URINALYSIS W MICROSCOPIC + REFLEX CULTURE
BILIRUBIN URINE: NEGATIVE
CRYSTALS: NONE SEEN [HPF]
Casts: NONE SEEN [LPF]
GLUCOSE, UA: NEGATIVE
Hgb urine dipstick: NEGATIVE
Nitrite: NEGATIVE
PH: 7 (ref 5.0–8.0)
PROTEIN: NEGATIVE
RBC / HPF: NONE SEEN RBC/HPF (ref <=2)
Specific Gravity, Urine: 1.02 (ref 1.001–1.035)
Yeast: NONE SEEN [HPF]

## 2016-05-20 LAB — WET PREP FOR TRICH, YEAST, CLUE
Trich, Wet Prep: NONE SEEN
YEAST WET PREP: NONE SEEN

## 2016-05-20 MED ORDER — METRONIDAZOLE 500 MG PO TABS: 500.0000 mg | ORAL_TABLET | Freq: Two times a day (BID) | ORAL | 0 refills | Status: DC

## 2016-05-20 NOTE — Progress Notes (Signed)
Presents with complaint of increased vaginal white discharge for the past week with occasional odor. States used over-the-counter Monistat with no relief. Denies urinary symptoms, pain, burning, pain at end of stream, abdominal pain or fever. New partner. Denies need for STD screening, negative 11/2015. Monthly cycle using condoms.  Exam: Appears well. External genitalia within normal limits, speculum exam moderate amount of white discharge with odor noted, wet prep positive for many clues, TNTC bacteria, GC/Chlamydia culture taken. Bimanual no CMT or adnexal tenderness. UA: Trace ketones, trace leukocytes, 6-10 WBCs, 20-40 squamous epithelials, many bacteria, clue cells present. Continues to have problems with hidradenitis suppurativa of both axillas with minimal relief with Keflex.  Bacteria vaginosis Hidradenitis suppurativa of axillas STD screen Contraception management  Plan: Flagyl 500 twice daily for 7 days, alcohol precautions reviewed. Instructed to call if no relief of symptoms. GC/Chlamydia culture pending declines HIV, hepatitis or RPR. Urine culture pending. Instructed to section options reviewed and declined will continue condoms. Requests surgical referral for evaluation of hidradenitis . Will get scheduled.

## 2016-05-20 NOTE — Patient Instructions (Signed)

## 2016-05-21 ENCOUNTER — Telehealth: Payer: Self-pay | Admitting: *Deleted

## 2016-05-21 LAB — GC/CHLAMYDIA PROBE AMP
CT Probe RNA: NOT DETECTED
GC Probe RNA: NOT DETECTED

## 2016-05-21 LAB — URINE CULTURE

## 2016-05-21 NOTE — Telephone Encounter (Signed)
Referral faxed to [facility] they will fax me back with time and date to relay to patient. 

## 2016-05-21 NOTE — Telephone Encounter (Signed)
-----   Message from Harrington ChallengerNancy J Young, NP sent at 05/20/2016  4:32 PM EST ----- Schedule surgical consult to evaluate hydradenitis Suppurativa. Has had problems for years. Best time is after 4, or late day if possible. Thanks

## 2016-05-22 NOTE — Telephone Encounter (Signed)
Appointment on 06/04/16 @ 9:00am with Dr.Gerkin left message for pt to call.

## 2016-05-28 NOTE — Telephone Encounter (Signed)
Pt informed with the below time and date.

## 2016-06-14 ENCOUNTER — Encounter (HOSPITAL_COMMUNITY): Payer: Self-pay | Admitting: Emergency Medicine

## 2016-06-14 ENCOUNTER — Emergency Department (HOSPITAL_COMMUNITY)
Admission: EM | Admit: 2016-06-14 | Discharge: 2016-06-14 | Disposition: A | Discharge status: Home / Self Care | Payer: PRIVATE HEALTH INSURANCE | Attending: Emergency Medicine | Admitting: Emergency Medicine

## 2016-06-14 DIAGNOSIS — M545 Low back pain: Secondary | ICD-10-CM | POA: Insufficient documentation

## 2016-06-14 DIAGNOSIS — X500XXA Overexertion from strenuous movement or load, initial encounter: Secondary | ICD-10-CM | POA: Insufficient documentation

## 2016-06-14 DIAGNOSIS — Y999 Unspecified external cause status: Secondary | ICD-10-CM | POA: Insufficient documentation

## 2016-06-14 DIAGNOSIS — Z87891 Personal history of nicotine dependence: Secondary | ICD-10-CM | POA: Insufficient documentation

## 2016-06-14 DIAGNOSIS — Y939 Activity, unspecified: Secondary | ICD-10-CM | POA: Insufficient documentation

## 2016-06-14 DIAGNOSIS — Y929 Unspecified place or not applicable: Secondary | ICD-10-CM | POA: Insufficient documentation

## 2016-06-14 MED ORDER — METHOCARBAMOL 500 MG PO TABS: 500.0000 mg | ORAL_TABLET | Freq: Two times a day (BID) | ORAL | 0 refills | Status: DC

## 2016-06-14 MED ORDER — NAPROXEN 500 MG PO TABS: 500.0000 mg | ORAL_TABLET | Freq: Two times a day (BID) | ORAL | 0 refills | Status: DC

## 2016-06-14 MED ORDER — KETOROLAC TROMETHAMINE 60 MG/2ML IM SOLN
60.0000 mg | Freq: Once | INTRAMUSCULAR | Status: AC
  Administered 2016-06-14: 60 mg via INTRAMUSCULAR
  Filled 2016-06-14: qty 2

## 2016-06-14 NOTE — Discharge Instructions (Signed)
Take the prescribed medication as directed. °Follow-up with your primary care doctor. °Return to the ED for new or worsening symptoms. °

## 2016-06-14 NOTE — ED Provider Notes (Signed)
[facility]-EMERGENCY DEPT Provider Note   CSN: [id number] Arrival date & time: 06/14/16  1149  By signing my name below, I, Crystal Barton, attest that this documentation has been prepared under the direction and in the presence of Sharilyn SitesLisa Sanders, PA-C.  Electronically Signed: Majel HomerPeyton Barton, ED Scribe. 06/14/16. 1:01 PM.  History   Chief Complaint Chief Complaint  Patient presents with  . Back Pain   The history is provided by the patient. No language interpreter was used.   HPI Comments: Crystal Barton is a 25 y.o. female who presents to the Emergency Department complaining of gradually worsening, lower back pain that began last night. 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 She notes she was bending down while cleaning and also performed a large amount of heavy lifting while at work. She states her pain is exacerbated when moving from a seated to standing position and is not relieved when lying down. She notes she has taken ibuprofen for her pain with no relief. She denies any recent falls or injury to her back.  No numbness or weakness of her legs. No bowel or bladder incontinence. History of back injuries or surgeries in the past.  Past Medical History:  Diagnosis Date  . STD (sexually transmitted disease)    Chlamydia   Patient Active Problem List   Diagnosis Date Noted  . Dysmenorrhea 01/02/2016  . Morbid obesity (HCC) 11/02/2013   History reviewed. No pertinent surgical history.  OB History    Gravida Para Term Preterm AB Living   2       1 0   SAB TAB Ectopic Multiple Live Births   1             Home Medications    Prior to Admission medications   Medication Sig Start Date End Date Taking? Authorizing Provider  metroNIDAZOLE (FLAGYL) 500 MG tablet Take 1 tablet (500 mg total) by mouth 2 (two) times daily. 05/20/16   Harrington ChallengerNancy J Young, NP    Family History No family history on file.  Social History Social  History  Substance Use Topics  . Smoking status: Former Smoker    Packs/day: 0.10    Types: Cigarettes  . Smokeless tobacco: Never Used  . Alcohol use 0.0 oz/week     Comment: Occas     Allergies   Patient has no known allergies.   Review of Systems Review of Systems  Constitutional: Negative for fever.  Musculoskeletal: Positive for back pain.  All other systems reviewed and are negative.  Physical Exam Updated Vital Signs BP 126/83 (BP Location: Left Arm)   Pulse 108   Temp 98.6 F (37 C) (Oral)   Resp 18   LMP 04/23/2016   SpO2 98%   Physical Exam  Constitutional: She is oriented to person, place, and time. She appears well-developed and well-nourished.  HENT:  Head: Normocephalic and atraumatic.  Mouth/Throat: Oropharynx is clear and moist.  Eyes: Conjunctivae and EOM are normal. Pupils are equal, round, and reactive to light.  Neck: Normal range of motion.  Cardiovascular: Normal rate, regular rhythm and normal heart sounds.   Pulmonary/Chest: Effort normal and breath sounds normal.  Abdominal: Soft. Bowel sounds are normal.  Musculoskeletal: Normal range of motion.  Endorses pain of the low back/lumbar region without any focal tenderness, no midline step-off or deformity, full range of motion maintained, normal strength and sensation of both legs, normal  gait  Neurological: She is alert and oriented to person, place, and time.  Skin: Skin is warm and dry.  Psychiatric: She has a normal mood and affect.  Nursing note and vitals reviewed.  ED Treatments / Results  Labs (all labs ordered are listed, but only abnormal results are displayed) Labs Reviewed - No data to display  EKG  EKG Interpretation None       Radiology No results found.  Procedures Procedures (including critical care time)  Medications Ordered in ED Medications - No data to display  DIAGNOSTIC STUDIES:  Oxygen Saturation is 98% on RA, normal by my interpretation.     COORDINATION OF CARE:  12:57 PM Discussed treatment plan with pt at bedside and pt agreed to plan.  Initial Impression / Assessment and Plan / ED Course  I have reviewed the triage vital signs and the nursing notes.  Pertinent labs & imaging results that were available during my care of the patient were reviewed by me and considered in my medical decision making (see chart for details).  Clinical Course    25 year old female here with low back pain. 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 Reports pain of the low back however there is no tenderness, step-off, or bony deformity. She is neurologically intact without any focal deficits. No red flag symptoms concerning for cauda equina or other emergent spinal pathology. Suspect lumbar strain. Will treat symptomatically.  Discussed plan with patient, she acknowledged understanding and agreed with plan of care.  Return precautions given for new or worsening symptoms.  Final Clinical Impressions(s) / ED Diagnoses   Final diagnoses:  Acute bilateral low back pain, with sciatica presence unspecified    New Prescriptions Discharge Medication List as of 06/14/2016  1:44 PM    START taking these medications   Details  methocarbamol (ROBAXIN) 500 MG tablet Take 1 tablet (500 mg total) by mouth 2 (two) times daily., Starting Sat 06/14/2016, Print    naproxen (NAPROSYN) 500 MG tablet Take 1 tablet (500 mg total) by mouth 2 (two) times daily with a meal., Starting Sat 06/14/2016, Print       I personally performed the services described in this documentation, which was scribed in my presence. The recorded information has been reviewed and is accurate.   Garlon HatchetLisa M Sanders, PA-C 06/14/16 1428    Tilden FossaElizabeth Rees, MD 06/17/16 [phone]

## 2016-06-14 NOTE — ED Triage Notes (Signed)
Patient c/o lower back pain since yesterday. 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 Patient denies any urinary problems.  Patient is worse with movement.

## 2016-09-11 ENCOUNTER — Encounter (HOSPITAL_COMMUNITY): Payer: Self-pay | Admitting: Emergency Medicine

## 2016-09-11 ENCOUNTER — Emergency Department (HOSPITAL_COMMUNITY)
Admission: EM | Admit: 2016-09-11 | Discharge: 2016-09-11 | Disposition: A | Discharge status: Home / Self Care | Payer: PRIVATE HEALTH INSURANCE | Attending: Emergency Medicine | Admitting: Emergency Medicine

## 2016-09-11 DIAGNOSIS — L02411 Cutaneous abscess of right axilla: Secondary | ICD-10-CM | POA: Insufficient documentation

## 2016-09-11 DIAGNOSIS — F17210 Nicotine dependence, cigarettes, uncomplicated: Secondary | ICD-10-CM | POA: Insufficient documentation

## 2016-09-11 MED ORDER — SULFAMETHOXAZOLE-TRIMETHOPRIM 800-160 MG PO TABS: 1.0000 | ORAL_TABLET | Freq: Two times a day (BID) | ORAL | 0 refills | Status: AC

## 2016-09-11 MED ORDER — LIDOCAINE HCL (PF) 1 % IJ SOLN
30.0000 mL | Freq: Once | INTRAMUSCULAR | Status: AC
  Administered 2016-09-11: 30 mL
  Filled 2016-09-11: qty 30

## 2016-09-11 NOTE — ED Triage Notes (Signed)
Pt c/o painful abscess to right axilla onset yesterday. Palpable abscess is hot, red, without induration. No fevers/chills/n/v.

## 2016-09-11 NOTE — Discharge Instructions (Signed)
You have been seen in the Emergency Department (ED) today for an abscess.  This was drained in the ED.  Please follow up with your doctor in 24-48 hours for recheck of your wound.  Read through the additional discharge instructions included below regarding wound care recommendations.  Keep the wound clean and dry, though you may wash as you would normally.  Change the dressing twice daily.  Call your doctor sooner or return to the ED if you develop worsening signs of infection such as: increased redness, increased pain, pus, or fever.   Abscess An abscess is an infected area that contains a collection of pus and debris. It can occur in almost any part of the body. An abscess is also known as a furuncle or boil. CAUSES  An abscess occurs when tissue gets infected. This can occur from blockage of oil or sweat glands, infection of hair follicles, or a minor injury to the skin. As the body tries to fight the infection, pus collects in the area and creates pressure under the skin. This pressure causes pain. People with weakened immune systems have difficulty fighting infections and get certain abscesses more often.  SYMPTOMS Usually an abscess develops on the skin and becomes a painful mass that is red, warm, and tender. If the abscess forms under the skin, you may feel a moveable soft area under the skin. Some abscesses break open (rupture) on their own, but most will continue to get worse without care. The infection can spread deeper into the body and eventually into the bloodstream, causing you to feel ill.  DIAGNOSIS  Your caregiver will take your medical history and perform a physical exam. A sample of fluid may also be taken from the abscess to determine what is causing your infection. TREATMENT  Your caregiver may prescribe antibiotic medicines to fight the infection. However, taking antibiotics alone usually does not cure an abscess. Your caregiver may need to make a small cut (incision) in the  abscess to drain the pus. In some cases, gauze is packed into the abscess to reduce pain and to continue draining the area. HOME CARE INSTRUCTIONS   Only take over-the-counter or prescription medicines for pain, discomfort, or fever as directed by your caregiver.  If you were prescribed antibiotics, take them as directed. Finish them even if you start to feel better.  If gauze is used, follow your caregiver's directions for changing the gauze.  To avoid spreading the infection:  Keep your draining abscess covered with a bandage.  Wash your hands well.  Do not share personal care items, towels, or whirlpools with others.  Avoid skin contact with others.  Keep your skin and clothes clean around the abscess.  Keep all follow-up appointments as directed by your caregiver. SEEK MEDICAL CARE IF:   You have increased pain, swelling, redness, fluid drainage, or bleeding.  You have muscle aches, chills, or a general ill feeling.  You have a fever. MAKE SURE YOU:   Understand these instructions.  Will watch your condition.  Will get help right away if you are not doing well or get worse. Document Released: 03/12/2005 Document Revised: 12/02/2011 Document Reviewed: 08/15/2011 [facility] Patient Information 2015 [place], [place]. This information is not intended to replace advice given to you by your health care provider. Make sure you discuss any questions you have with your health care provider.  Abscess Care After An abscess (also called a boil or furuncle) is an infected area that contains a collection of pus.  Signs and symptoms of an abscess include pain, tenderness, redness, or hardness, or you may feel a moveable soft area under your skin. An abscess can occur anywhere in the body. The infection may spread to surrounding tissues causing cellulitis. A cut (incision) by the surgeon was made over your abscess and the pus was drained out. Gauze may have been packed into the space to  provide a drain that will allow the cavity to heal from the inside outwards. The boil may be painful for 5 to 7 days. Most people with a boil do not have high fevers. Your abscess, if seen early, may not have localized, and may not have been lanced. If not, another appointment may be required for this if it does not get better on its own or with medications. HOME CARE INSTRUCTIONS   Only take over-the-counter or prescription medicines for pain, discomfort, or fever as directed by your caregiver.  When you bathe, soak and then remove gauze or iodoform packs at least daily or as directed by your caregiver. You may then wash the wound gently with mild soapy water. Repack with gauze or do as your caregiver directs. SEEK IMMEDIATE MEDICAL CARE IF:   You develop increased pain, swelling, redness, drainage, or bleeding in the wound site.  You develop signs of generalized infection including muscle aches, chills, fever, or a general ill feeling.  An oral temperature above 102 F (38.9 C) develops, not controlled by medication. See your caregiver for a recheck if you develop any of the symptoms described above. If medications (antibiotics) were prescribed, take them as directed. Document Released: 12/19/2004 Document Revised: 08/25/2011 Document Reviewed: 08/16/2007 [facility] Patient Information 2015 [place], [place]. This information is not intended to replace advice given to you by your health care provider. Make sure you discuss any questions you have with your health care provider.  Cellulitis Cellulitis is an infection of the skin and the tissue beneath it. The infected area is usually red and tender. Cellulitis occurs most often in the arms and lower legs.  CAUSES  Cellulitis is caused by bacteria that enter the skin through cracks or cuts in the skin. The most common types of bacteria that cause cellulitis are staphylococci and streptococci. SIGNS AND SYMPTOMS   Redness and  warmth.  Swelling.  Tenderness or pain.  Fever. DIAGNOSIS  Your health care provider can usually determine what is wrong based on a physical exam. Blood tests may also be done. TREATMENT  Treatment usually involves taking an antibiotic medicine. HOME CARE INSTRUCTIONS   Take your antibiotic medicine as directed by your health care provider. Finish the antibiotic even if you start to feel better.  Keep the infected arm or leg elevated to reduce swelling.  Apply a warm cloth to the affected area up to 4 times per day to relieve pain.  Take medicines only as directed by your health care provider.  Keep all follow-up visits as directed by your health care provider. SEEK MEDICAL CARE IF:   You notice red streaks coming from the infected area.  Your red area gets larger or turns dark in color.  Your bone or joint underneath the infected area becomes painful after the skin has healed.  Your infection returns in the same area or another area.  You notice a swollen bump in the infected area.  You develop new symptoms.  You have a fever. SEEK IMMEDIATE MEDICAL CARE IF:   You feel very sleepy.  You develop vomiting or diarrhea.  You have a general ill feeling (malaise) with muscle aches and pains. MAKE SURE YOU:   Understand these instructions.  Will watch your condition.  Will get help right away if you are not doing well or get worse. Document Released: 03/12/2005 Document Revised: 10/17/2013 Document Reviewed: 08/18/2011 [facility] Patient Information 2015 [place], [place]. This information is not intended to replace advice given to you by your health care provider. Make sure you discuss any questions you have with your health care provider.

## 2016-09-11 NOTE — ED Provider Notes (Signed)
Emergency Department Provider Note   I have reviewed the triage vital signs and the nursing notes.   HISTORY  Chief Complaint Abscess   HPI Crystal Barton is a 26 y.o. female with PMH of prior abscess as to the emergency department for evaluation of pain and swelling in the right axilla. She notes history of prior abscess that typically opens on its own. She's never had an abscess so large or painful. Patient noticed the lesion yesterday and has gradually gotten larger. She tried warm compresses to the area with no relief. She denies any associated fever, chills, nausea, vomiting, diarrhea. Additional lesions in other locations. She denies any numbness or tingling in the arm or hand. No bleeding from the site. No alleviating factors. No radiation of pain.   Past Medical History:  Diagnosis Date  . STD (sexually transmitted disease)    Chlamydia    Patient Active Problem List   Diagnosis Date Noted  . Dysmenorrhea 01/02/2016  . Morbid obesity (HCC) 11/02/2013    History reviewed. No pertinent surgical history.  Current Outpatient Rx  . Order #: [id number] Class: Print  . Order #: [id number] Class: Normal  . Order #: [id number] Class: Print  . Order #: [id number] Class: Print    Allergies Patient has no known allergies.  History reviewed. No pertinent family history.  Social History Social History  Substance Use Topics  . Smoking status: Current Every Day Smoker    Packs/day: 0.10    Types: Cigarettes  . Smokeless tobacco: Never Used  . Alcohol use 0.0 oz/week     Comment: Occas    Review of Systems  10-point ROS otherwise negative.  ____________________________________________   PHYSICAL EXAM:  VITAL SIGNS: ED Triage Vitals  Enc Vitals Group     BP 09/11/16 0756 136/90     Pulse Rate 09/11/16 0756 (!) 109     Resp 09/11/16 0756 18     Temp 09/11/16 0810 97.9 F (36.6 C)     Temp Source 09/11/16 0810 Oral     SpO2 09/11/16 0756 98 %     Weight  09/11/16 0756 242 lb (109.8 kg)     Height 09/11/16 0756 5' 8.5" (1.74 m)     Pain Score 09/11/16 0758 10   Constitutional: Alert and oriented. Well appearing and in no acute distress. Eyes: Conjunctivae are normal.  Head: Atraumatic. Nose: No congestion/rhinnorhea. Mouth/Throat: Mucous membranes are moist.   Neck: No stridor.  Cardiovascular: Normal rate, regular rhythm. Good peripheral circulation. Grossly normal heart sounds.   Respiratory: Normal respiratory effort.  No retractions. Lungs CTAB. Musculoskeletal: No lower extremity tenderness nor edema. No gross deformities of extremities. Neurologic:  Normal speech and language. No gross focal neurologic deficits are appreciated.  Skin:  Skin is warm, dry and intact. 6 x 2 cm fluctuant abscess in the right axilla. Minimal surrounding induration and minimal surrounding erythema.  Psychiatric: Mood and affect are normal. Speech and behavior are normal.  ____________________________________________   PROCEDURES  Procedure(s) performed:   Marland Kitchen.Marland Kitchen.Incision and Drainage Date/Time: 09/11/2016 8:27 AM Performed by: Jonice Cerra G Authorized by: Maia PlanLONG, Ashmi Blas G   Consent:    Consent obtained:  Verbal   Consent given by:  Patient   Risks discussed:  Bleeding, incomplete drainage, pain, infection and damage to other organs   Alternatives discussed:  No treatment and alternative treatment Location:    Type:  Abscess   Size:  6 x 2 cm   Location:  Upper extremity   Upper extremity  location: right axilla. Pre-procedure details:    Skin preparation:  Chloraprep Anesthesia (see MAR for exact dosages):    Anesthesia method:  Local infiltration   Local anesthetic:  Lidocaine 1% w/o epi Procedure type:    Complexity:  Simple Procedure details:    Needle aspiration: no     Incision types:  Single straight   Incision depth:  Submucosal   Scalpel blade:  11   Wound management:  Probed and deloculated   Drainage:  Purulent   Drainage amount:   Moderate   Wound treatment:  Wound left open   Packing materials:  None Post-procedure details:    Patient tolerance of procedure:  Tolerated well, no immediate complications     ____________________________________________   INITIAL IMPRESSION / ASSESSMENT AND PLAN / ED COURSE  Pertinent labs & imaging results that were available during my care of the patient were reviewed by me and considered in my medical decision making (see chart for details).  Patient resents to the emergency department for evaluation of right axillary abscess. The area is fluctuant. No associated fevers or other signs of systemic infection. Patient's tachycardia on arrival is likely due to pain. The area was incised and drained as outlined above. Patient tolerated well. The wound was probed and deloculated. No indication for packing at this time. Will start patient on Bactrim for the next week to decrease chance of reaccumulation and repeat drainage. No known drug allergies. Discussed primary care physician follow-up and return precautions in detail.  At this time, I do not feel there is any life-threatening condition present. I have reviewed and discussed all results (EKG, imaging, lab, urine as appropriate), exam findings with patient. I have reviewed nursing notes and appropriate previous records.  I feel the patient is safe to be discharged home without further emergent workup. Discussed usual and customary return precautions. Patient and family (if present) verbalize understanding and are comfortable with this plan.  Patient will follow-up with their primary care provider. If they do not have a primary care provider, information for follow-up has been provided to them. All questions have been answered.  ____________________________________________  FINAL CLINICAL IMPRESSION(S) / ED DIAGNOSES  Final diagnoses:  Abscess of axilla, right     MEDICATIONS GIVEN DURING THIS VISIT:  Medications  lidocaine (PF)  (XYLOCAINE) 1 % injection 30 mL (not administered)     NEW OUTPATIENT MEDICATIONS STARTED DURING THIS VISIT:  New Prescriptions   SULFAMETHOXAZOLE-TRIMETHOPRIM (BACTRIM DS,SEPTRA DS) 800-160 MG TABLET    Take 1 tablet by mouth 2 (two) times daily.      Note:  This document was prepared using Dragon voice recognition software and may include unintentional dictation errors.  Alona Bene, MD Emergency Medicine   Maia Plan, MD 09/11/16 [phone]

## 2016-10-29 ENCOUNTER — Encounter: Payer: Self-pay | Admitting: Gynecology

## 2016-12-15 ENCOUNTER — Encounter: Payer: Self-pay | Admitting: Women's Health

## 2016-12-15 ENCOUNTER — Ambulatory Visit (INDEPENDENT_AMBULATORY_CARE_PROVIDER_SITE_OTHER): Payer: PRIVATE HEALTH INSURANCE | Admitting: Women's Health

## 2016-12-15 VITALS — BP 124/82 | Ht 68.0 in | Wt 273.0 lb

## 2016-12-15 DIAGNOSIS — Z01419 Encounter for gynecological examination (general) (routine) without abnormal findings: Secondary | ICD-10-CM

## 2016-12-15 DIAGNOSIS — R635 Abnormal weight gain: Secondary | ICD-10-CM | POA: Diagnosis not present

## 2016-12-15 DIAGNOSIS — B9689 Other specified bacterial agents as the cause of diseases classified elsewhere: Secondary | ICD-10-CM | POA: Diagnosis not present

## 2016-12-15 DIAGNOSIS — N760 Acute vaginitis: Secondary | ICD-10-CM

## 2016-12-15 DIAGNOSIS — N898 Other specified noninflammatory disorders of vagina: Secondary | ICD-10-CM

## 2016-12-15 LAB — CBC WITH DIFFERENTIAL/PLATELET
BASOS ABS: 0 {cells}/uL (ref 0–200)
Basophils Relative: 0 %
EOS ABS: 52 {cells}/uL (ref 15–500)
Eosinophils Relative: 1 %
HCT: 37.2 % (ref 35.0–45.0)
Hemoglobin: 12.1 g/dL (ref 11.7–15.5)
Lymphocytes Relative: 42 %
Lymphs Abs: 2184 cells/uL (ref 850–3900)
MCH: 27.4 pg (ref 27.0–33.0)
MCHC: 32.5 g/dL (ref 32.0–36.0)
MCV: 84.4 fL (ref 80.0–100.0)
MONOS PCT: 10 %
MPV: 9.2 fL (ref 7.5–12.5)
Monocytes Absolute: 520 cells/uL (ref 200–950)
NEUTROS PCT: 47 %
Neutro Abs: 2444 cells/uL (ref 1500–7800)
PLATELETS: 339 10*3/uL (ref 140–400)
RBC: 4.41 MIL/uL (ref 3.80–5.10)
RDW: 13.9 % (ref 11.0–15.0)
WBC: 5.2 10*3/uL (ref 3.8–10.8)

## 2016-12-15 LAB — WET PREP FOR TRICH, YEAST, CLUE
TRICH WET PREP: NONE SEEN
WBC, Wet Prep HPF POC: NONE SEEN
Yeast Wet Prep HPF POC: NONE SEEN

## 2016-12-15 LAB — TSH: TSH: 0.89 mIU/L

## 2016-12-15 LAB — GLUCOSE, RANDOM: Glucose, Bld: 81 mg/dL (ref 65–99)

## 2016-12-15 MED ORDER — METRONIDAZOLE 500 MG PO TABS: 500.0000 mg | ORAL_TABLET | Freq: Two times a day (BID) | ORAL | 0 refills | Status: DC

## 2016-12-15 NOTE — Progress Notes (Signed)
Crystal Barton 10/24/1990 [id number]    History:    Presents for annual exam.  Monthly cycle/condoms/same partner with negative STD screen. Gardasil series completed. Normal Pap history. 08/2016 abscess in the axilla area . 20 pound weight gain in the past year, relates to diet and exercise  Past medical history, past surgical history, family history and social history were all reviewed and documented in the EPIC chart. CNA friends. Parents healthy. Hoping to take classes for nursing school.  ROS:  A ROS was performed and pertinent positives and negatives are included.  Exam:  Vitals:   12/15/16 1411  BP: 124/82  Weight: 273 lb (123.8 kg)  Height: 5\' 8"  (1.727 m)   Body mass index is 41.51 kg/m.   General appearance:  Normal Thyroid:  Symmetrical, normal in size, without palpable masses or nodularity. Respiratory  Auscultation:  Clear without wheezing or rhonchi Cardiovascular  Auscultation:  Regular rate, without rubs, murmurs or gallops  Edema/varicosities:  Not grossly evident Abdominal  Soft,nontender, without masses, guarding or rebound.  Liver/spleen:  No organomegaly noted  Hernia:  None appreciated  Skin  Inspection:  Grossly normal   Breasts: Examined lying and sitting.     Right: Without masses, retractions, discharge or axillary adenopathy.     Left: Without masses, retractions, discharge or axillary adenopathy. Gentitourinary   Inguinal/mons:  Normal without inguinal adenopathy  External genitalia:  Normal  BUS/Urethra/Skene's glands:  Normal  Vagina:  Normal, Menses with odor, wet prep positive for moderate clues, many bacteria  Cervix:  Normal  Uterus:  normal in size, shape and contour.  Midline and mobile  Adnexa/parametria:     Rt: Without masses or tenderness.   Lt: Without masses or tenderness.  Anus and perineum: Normal    Assessment/Plan:  26 y.o. SBF G1 P0 for annual exam with complaint of weight gain.  Monthly cycle/condoms Bacteria  vaginosis Morbid obesity  Plan: Contraception options reviewed, declines will continue condoms. Flagyl 500 twice daily for 7 days, alcohol precautions reviewed. Reviewed importance of increasing exercise and decreasing calories for weight loss. CBC, TSH, glucose, Pap. New screening guidelines reviewed.    Harrington Challengerancy J Young [facility] CenterWHNP, 2:44 PM 12/15/2016

## 2016-12-15 NOTE — Addendum Note (Signed)
Addended by: Richardson ChiquitoWILKINSON, KARI S on: 12/15/2016 03:19 PM   Modules accepted: Orders

## 2016-12-15 NOTE — Patient Instructions (Addendum)
Bacterial Vaginosis Bacterial vaginosis is an infection of the vagina. It happens when too many germs (bacteria) grow in the vagina. This infection puts you at risk for infections from sex (STIs). Treating this infection can lower your risk for some STIs. You should also treat this if you are pregnant. It can cause your baby to be born early. Follow these instructions at home: Medicines  Take over-the-counter and prescription medicines only as told by your doctor.  Take or use your antibiotic medicine as told by your doctor. Do not stop taking or using it even if you start to feel better. General instructions  If you your sexual partner is a woman, tell her that you have this infection. She needs to get treatment if she has symptoms. If you have a female partner, he does not need to be treated.  During treatment: ? Avoid sex. ? Do not douche. ? Avoid alcohol as told. ? Avoid breastfeeding as told.  Drink enough fluid to keep your pee (urine) clear or pale yellow.  Keep your vagina and butt (rectum) clean. ? Wash the area with warm water every day. ? Wipe from front to back after you use the toilet.  Keep all follow-up visits as told by your doctor. This is important. Preventing this condition  Do not douche.  Use only warm water to wash around your vagina.  Use protection when you have sex. This includes: ? Latex condoms. ? Dental dams.  Limit how many people you have sex with. It is best to only have sex with the same person (be monogamous).  Get tested for STIs. Have your partner get tested.  Wear underwear that is cotton or lined with cotton.  Avoid tight pants and pantyhose. This is most important in summer.  Do not use any products that have nicotine or tobacco in them. These include cigarettes and e-cigarettes. If you need help quitting, ask your doctor.  Do not use illegal drugs.  Limit how much alcohol you drink. Contact a doctor if:  Your symptoms do not get  better, even after you are treated.  You have more discharge or pain when you pee (urinate).  You have a fever.  You have pain in your belly (abdomen).  You have pain with sex.  Your bleed from your vagina between periods. Summary  This infection happens when too many germs (bacteria) grow in the vagina.  Treating this condition can lower your risk for some infections from sex (STIs).  You should also treat this if you are pregnant. It can cause early (premature) birth.  Do not stop taking or using your antibiotic medicine even if you start to feel better. This information is not intended to replace advice given to you by your health care provider. Make sure you discuss any questions you have with your health care provider. Document Released: 03/11/2008 Document Revised: 02/16/2016 Document Reviewed: 02/16/2016 Elsevier Interactive Patient Education  2017 [employer] Health Maintenance, Female Adopting a healthy lifestyle and getting preventive care can go a long way to promote health and wellness. Talk with your health care provider about what schedule of regular examinations is right for you. This is a good chance for you to check in with your provider about disease prevention and staying healthy. In between checkups, there are plenty of things you can do on your own. Experts have done a lot of research about which lifestyle changes and preventive measures are most likely to keep you healthy. Ask your health care   provider for more information. Weight and diet Eat a healthy diet  Be sure to include plenty of vegetables, fruits, low-fat dairy products, and lean protein.  Do not eat a lot of foods high in solid fats, added sugars, or salt.  Get regular exercise. This is one of the most important things you can do for your health. ? Most adults should exercise for at least 150 minutes each week. The exercise should increase your heart rate and make you sweat (moderate-intensity  exercise). ? Most adults should also do strengthening exercises at least twice a week. This is in addition to the moderate-intensity exercise.  Maintain a healthy weight  Body mass index (BMI) is a measurement that can be used to identify possible weight problems. It estimates body fat based on height and weight. Your health care provider can help determine your BMI and help you achieve or maintain a healthy weight.  For females 36 years of age and older: ? A BMI below 18.5 is considered underweight. ? A BMI of 18.5 to 24.9 is normal. ? A BMI of 25 to 29.9 is considered overweight. ? A BMI of 30 and above is considered obese.  Watch levels of cholesterol and blood lipids  You should start having your blood tested for lipids and cholesterol at 26 years of age, then have this test every 5 years.  You may need to have your cholesterol levels checked more often if: ? Your lipid or cholesterol levels are high. ? You are older than 26 years of age. ? You are at high risk for heart disease.  Cancer screening Lung Cancer  Lung cancer screening is recommended for adults 39-35 years old who are at high risk for lung cancer because of a history of smoking.  A yearly low-dose CT scan of the lungs is recommended for people who: ? Currently smoke. ? Have quit within the past 15 years. ? Have at least a 30-pack-year history of smoking. A pack year is smoking an average of one pack of cigarettes a day for 1 year.  Yearly screening should continue until it has been 15 years since you quit.  Yearly screening should stop if you develop a health problem that would prevent you from having lung cancer treatment.  Breast Cancer  Practice breast self-awareness. This means understanding how your breasts normally appear and feel.  It also means doing regular breast self-exams. Let your health care provider know about any changes, no matter how small.  If you are in your 20s or 30s, you should have a  clinical breast exam (CBE) by a health care provider every 1-3 years as part of a regular health exam.  If you are 55 or older, have a CBE every year. Also consider having a breast X-ray (mammogram) every year.  If you have a family history of breast cancer, talk to your health care provider about genetic screening.  If you are at high risk for breast cancer, talk to your health care provider about having an MRI and a mammogram every year.  Breast cancer gene (BRCA) assessment is recommended for women who have family members with BRCA-related cancers. BRCA-related cancers include: ? Breast. ? Ovarian. ? Tubal. ? Peritoneal cancers.  Results of the assessment will determine the need for genetic counseling and BRCA1 and BRCA2 testing.  Cervical Cancer Your health care provider may recommend that you be screened regularly for cancer of the pelvic organs (ovaries, uterus, and vagina). This screening involves a pelvic examination,  including checking for microscopic changes to the surface of your cervix (Pap test). You may be encouraged to have this screening done every 3 years, beginning at age 21.  For women ages 30-65, health care providers may recommend pelvic exams and Pap testing every 3 years, or they may recommend the Pap and pelvic exam, combined with testing for human papilloma virus (HPV), every 5 years. Some types of HPV increase your risk of cervical cancer. Testing for HPV may also be done on women of any age with unclear Pap test results.  Other health care providers may not recommend any screening for nonpregnant women who are considered low risk for pelvic cancer and who do not have symptoms. Ask your health care provider if a screening pelvic exam is right for you.  If you have had past treatment for cervical cancer or a condition that could lead to cancer, you need Pap tests and screening for cancer for at least 20 years after your treatment. If Pap tests have been discontinued,  your risk factors (such as having a new sexual partner) need to be reassessed to determine if screening should resume. Some women have medical problems that increase the chance of getting cervical cancer. In these cases, your health care provider may recommend more frequent screening and Pap tests.  Colorectal Cancer  This type of cancer can be detected and often prevented.  Routine colorectal cancer screening usually begins at 26 years of age and continues through 26 years of age.  Your health care provider may recommend screening at an earlier age if you have risk factors for colon cancer.  Your health care provider may also recommend using home test kits to check for hidden blood in the stool.  A small camera at the end of a tube can be used to examine your colon directly (sigmoidoscopy or colonoscopy). This is done to check for the earliest forms of colorectal cancer.  Routine screening usually begins at age 50.  Direct examination of the colon should be repeated every 5-10 years through 26 years of age. However, you may need to be screened more often if early forms of precancerous polyps or small growths are found.  Skin Cancer  Check your skin from head to toe regularly.  Tell your health care provider about any new moles or changes in moles, especially if there is a change in a mole's shape or color.  Also tell your health care provider if you have a mole that is larger than the size of a pencil eraser.  Always use sunscreen. Apply sunscreen liberally and repeatedly throughout the day.  Protect yourself by wearing long sleeves, pants, a wide-brimmed hat, and sunglasses whenever you are outside.  Heart disease, diabetes, and high blood pressure  High blood pressure causes heart disease and increases the risk of stroke. High blood pressure is more likely to develop in: ? People who have blood pressure in the high end of the normal range (130-139/85-89 mm Hg). ? People who are  overweight or obese. ? People who are African American.  If you are 18-39 years of age, have your blood pressure checked every 3-5 years. If you are 40 years of age or older, have your blood pressure checked every year. You should have your blood pressure measured twice-once when you are at a hospital or clinic, and once when you are not at a hospital or clinic. Record the average of the two measurements. To check your blood pressure when you are not   at a hospital or clinic, you can use: ? An automated blood pressure machine at a pharmacy. ? A home blood pressure monitor.  If you are between 59 years and 66 years old, ask your health care provider if you should take aspirin to prevent strokes.  Have regular diabetes screenings. This involves taking a blood sample to check your fasting blood sugar level. ? If you are at a normal weight and have a low risk for diabetes, have this test once every three years after 26 years of age. ? If you are overweight and have a high risk for diabetes, consider being tested at a younger age or more often. Preventing infection Hepatitis B  If you have a higher risk for hepatitis B, you should be screened for this virus. You are considered at high risk for hepatitis B if: ? You were born in a country where hepatitis B is common. Ask your health care provider which countries are considered high risk. ? Your parents were born in a high-risk country, and you have not been immunized against hepatitis B (hepatitis B vaccine). ? You have HIV or AIDS. ? You use needles to inject street drugs. ? You live with someone who has hepatitis B. ? You have had sex with someone who has hepatitis B. ? You get hemodialysis treatment. ? You take certain medicines for conditions, including cancer, organ transplantation, and autoimmune conditions.  Hepatitis C  Blood testing is recommended for: ? Everyone born from [birth year] through 1965. ? Anyone with known risk factors for  hepatitis C.  Sexually transmitted infections (STIs)  You should be screened for sexually transmitted infections (STIs) including gonorrhea and chlamydia if: ? You are sexually active and are younger than 26 years of age. ? You are older than 26 years of age and your health care provider tells you that you are at risk for this type of infection. ? Your sexual activity has changed since you were last screened and you are at an increased risk for chlamydia or gonorrhea. Ask your health care provider if you are at risk.  If you do not have HIV, but are at risk, it may be recommended that you take a prescription medicine daily to prevent HIV infection. This is called pre-exposure prophylaxis (PrEP). You are considered at risk if: ? You are sexually active and do not regularly use condoms or know the HIV status of your partner(s). ? You take drugs by injection. ? You are sexually active with a partner who has HIV.  Talk with your health care provider about whether you are at high risk of being infected with HIV. If you choose to begin PrEP, you should first be tested for HIV. You should then be tested every 3 months for as long as you are taking PrEP. Pregnancy  If you are premenopausal and you may become pregnant, ask your health care provider about preconception counseling.  If you may become pregnant, take 400 to 800 micrograms (mcg) of folic acid every day.  If you want to prevent pregnancy, talk to your health care provider about birth control (contraception). Osteoporosis and menopause  Osteoporosis is a disease in which the bones lose minerals and strength with aging. This can result in serious bone fractures. Your risk for osteoporosis can be identified using a bone density scan.  If you are 57 years of age or older, or if you are at risk for osteoporosis and fractures, ask your health care provider if you should be  screened.  Ask your health care provider whether you should take a  calcium or vitamin D supplement to lower your risk for osteoporosis.  Menopause may have certain physical symptoms and risks.  Hormone replacement therapy may reduce some of these symptoms and risks. Talk to your health care provider about whether hormone replacement therapy is right for you. Follow these instructions at home:  Schedule regular health, dental, and eye exams.  Stay current with your immunizations.  Do not use any tobacco products including cigarettes, chewing tobacco, or electronic cigarettes.  If you are pregnant, do not drink alcohol.  If you are breastfeeding, limit how much and how often you drink alcohol.  Limit alcohol intake to no more than 1 drink per day for nonpregnant women. One drink equals 12 ounces of beer, 5 ounces of wine, or 1 ounces of hard liquor.  Do not use street drugs.  Do not share needles.  Ask your health care provider for help if you need support or information about quitting drugs.  Tell your health care provider if you often feel depressed.  Tell your health care provider if you have ever been abused or do not feel safe at home. This information is not intended to replace advice given to you by your health care provider. Make sure you discuss any questions you have with your health care provider. Document Released: 12/16/2010 Document Revised: 11/08/2015 Document Reviewed: 03/06/2015 Elsevier Interactive Patient Education  2018 [employer]. Carbohydrate Counting for Diabetes Mellitus, Adult Carbohydrate counting is a method for keeping track of how many carbohydrates you eat. Eating carbohydrates naturally increases the amount of sugar (glucose) in the blood. Counting how many carbohydrates you eat helps keep your blood glucose within normal limits, which helps you manage your diabetes (diabetes mellitus). It is important to know how many carbohydrates you can safely have in each meal. This is different for every person. A diet and  nutrition specialist (registered dietitian) can help you make a meal plan and calculate how many carbohydrates you should have at each meal and snack. Carbohydrates are found in the following foods:  Grains, such as breads and cereals.  Dried beans and soy products.  Starchy vegetables, such as potatoes, peas, and corn.  Fruit and fruit juices.  Milk and yogurt.  Sweets and snack foods, such as cake, cookies, candy, chips, and soft drinks.  How do I count carbohydrates? There are two ways to count carbohydrates in food. You can use either of the methods or a combination of both. Reading "Nutrition Facts" on packaged food The "Nutrition Facts" list is included on the labels of almost all packaged foods and beverages in the [place] It includes:  The serving size.  Information about nutrients in each serving, including the grams (g) of carbohydrate per serving.  To use the "Nutrition Facts":  Decide how many servings you will have.  Multiply the number of servings by the number of carbohydrates per serving.  The resulting number is the total amount of carbohydrates that you will be having.  Learning standard serving sizes of other foods When you eat foods containing carbohydrates that are not packaged or do not include "Nutrition Facts" on the label, you need to measure the servings in order to count the amount of carbohydrates:  Measure the foods that you will eat with a food scale or measuring cup, if needed.  Decide how many standard-size servings you will eat.  Multiply the number of servings by 15. Most carbohydrate-rich foods  have about 15 g of carbohydrates per serving. ? For example, if you eat 8 oz (170 g) of strawberries, you will have eaten 2 servings and 30 g of carbohydrates (2 servings x 15 g = 30 g).  For foods that have more than one food mixed, such as soups and casseroles, you must count the carbohydrates in each food that is included.  The following list  contains standard serving sizes of common carbohydrate-rich foods. Each of these servings has about 15 g of carbohydrates:   hamburger bun or  English muffin.   oz (15 mL) syrup.   oz (14 g) jelly.  1 slice of bread.  1 six-inch tortilla.  3 oz (85 g) cooked rice or pasta.  4 oz (113 g) cooked dried beans.  4 oz (113 g) starchy vegetable, such as peas, corn, or potatoes.  4 oz (113 g) hot cereal.  4 oz (113 g) mashed potatoes or  of a large baked potato.  4 oz (113 g) canned or frozen fruit.  4 oz (120 mL) fruit juice.  4-6 crackers.  6 chicken nuggets.  6 oz (170 g) unsweetened dry cereal.  6 oz (170 g) plain fat-free yogurt or yogurt sweetened with artificial sweeteners.  8 oz (240 mL) milk.  8 oz (170 g) fresh fruit or one small piece of fruit.  24 oz (680 g) popped popcorn.  Example of carbohydrate counting Sample meal  3 oz (85 g) chicken breast.  6 oz (170 g) brown rice.  4 oz (113 g) corn.  8 oz (240 mL) milk.  8 oz (170 g) strawberries with sugar-free whipped topping. Carbohydrate calculation 1. Identify the foods that contain carbohydrates: ? Rice. ? Corn. ? Milk. ? Strawberries. 2. Calculate how many servings you have of each food: ? 2 servings rice. ? 1 serving corn. ? 1 serving milk. ? 1 serving strawberries. 3. Multiply each number of servings by 15 g: ? 2 servings rice x 15 g = 30 g. ? 1 serving corn x 15 g = 15 g. ? 1 serving milk x 15 g = 15 g. ? 1 serving strawberries x 15 g = 15 g. 4. Add together all of the amounts to find the total grams of carbohydrates eaten: ? 30 g + 15 g + 15 g + 15 g = 75 g of carbohydrates total. This information is not intended to replace advice given to you by your health care provider. Make sure you discuss any questions you have with your health care provider. Document Released: 06/02/2005 Document Revised: 12/21/2015 Document Reviewed: 11/14/2015 Elsevier Interactive Patient Education  [employer].

## 2016-12-19 LAB — PAP IG W/ RFLX HPV ASCU

## 2016-12-22 LAB — HUMAN PAPILLOMAVIRUS, HIGH RISK: HPV DNA HIGH RISK: DETECTED — AB

## 2017-01-30 ENCOUNTER — Encounter: Payer: Self-pay | Admitting: Obstetrics & Gynecology

## 2017-01-30 ENCOUNTER — Ambulatory Visit (INDEPENDENT_AMBULATORY_CARE_PROVIDER_SITE_OTHER): Payer: PRIVATE HEALTH INSURANCE | Admitting: Obstetrics & Gynecology

## 2017-01-30 VITALS — BP 118/74

## 2017-01-30 DIAGNOSIS — R87610 Atypical squamous cells of undetermined significance on cytologic smear of cervix (ASC-US): Secondary | ICD-10-CM | POA: Diagnosis not present

## 2017-01-30 DIAGNOSIS — R87810 Cervical high risk human papillomavirus (HPV) DNA test positive: Secondary | ICD-10-CM

## 2017-01-30 DIAGNOSIS — Z113 Encounter for screening for infections with a predominantly sexual mode of transmission: Secondary | ICD-10-CM

## 2017-01-30 NOTE — Patient Instructions (Addendum)
1. ASCUS with positive high risk HPV cervical Colposopy as described above.  Pending HPV 16-18-45 and Cervical Bxs.  Had received Gardasil.  Probable Cervical Dysplasia.  Management per results.  2. Screening examination for venereal disease STI screen.  Declines blood work for full STI screen. - GC/Chlamydia Probe Amp  Crystal Barton, it was a pleasure to meet you today!  I will inform you of your results as soon as available.   Human Papillomavirus Human papillomavirus (HPV) is the most common sexually transmitted infection (STI). It easily spreads from person to person (is highly contagious). HPV infections cause genital warts. Certain types of HPV may cause cancers, including cancer of the lower part of the uterus (cervix), vagina, outer female genital area (vulva), penis, anus, and rectum. HPV may also cause cancers of the oral cavity, such as the throat, tongue, and tonsils. There are many types of HPV. It usually does not cause symptoms. However, sometimes there are wart-like lesions in the throat or warts in the genital area that you can see or feel. It is possible to be infected for long periods and pass HPV to others without knowing it. What are the causes? HPV is caused by a virus that spreads from person to person through sexual contact. This includes oral, vaginal, or anal sex. What increases the risk? The following factors may make you more likely to develop this condition:  Having unprotected oral, vaginal, or anal sex.  Having several sex partners.  Having a sex partner who has other sex partners.  Having or having had another STI.  Having a weak disease-fighting (immune) system.  Having damaged skin in the genital area.  What are the signs or symptoms? Most people who have HPV do not have any symptoms. If symptoms are present, they may include:  Wartlike lesions in the throat (from having oral sex).  Warts on the infected skin or mucous membranes.  Genital warts that may  itch, burn, bleed, or be painful during sexual intercourse.  How is this diagnosed? If wartlike lesions are present in the throat or if genital warts are present, your health care provider can usually diagnose HPV with a physical exam. Genital warts are easily seen. In females, tests may be used to diagnose HPV, including:  A Pap test. A Pap test takes a sample of cells from your cervix to check for cancer and HPV infection.  An HPV test. This is similar to a Pap test and involves taking a sample of cells from your cervix.  Using a scope to view the cervix (colposcopy). This may be done if a pelvic exam or Pap test is abnormal. A sample of tissue may be removed for testing (biopsy) during the colposcopy.  Currently, there is no test to detect HPV in males. How is this treated? There is no treatment for the virus itself. However, there are treatments for the health problems and symptoms HPV can cause. Your health care provider will monitor you closely after you are treated as HPV can come back and may need treatment again. Treatment for HPV may include:  Medicines, which may be injected or applied to genital warts in a cream, lotion, liquid or gel form.  Use of a probe to apply extreme cold (cryotherapy) to the genital warts.  Application of an intense beam of light (laser treatment) on the genital warts.  Use of a probe to apply extreme heat (electrocautery) on the genital warts.  Surgery to remove the genital warts.  Follow these  instructions at home: Medicines  Take over-the-counter and prescription medicines only as told by your health care provider. This include creams for itching or irritation.  Do not treat genital warts with medicines used for treating hand warts. General instructions  Do not touch or scratch the warts.  Do not have sex while you are being treated.  Do not douche or use tampons during treatment (women).  Tell your sex partner about your infection. He or  she may also need to be treated.  If you become pregnant, tell your health care provider that you have HPV. Your health care provider will monitor you closely during pregnancy to make sure your baby is safe.  Keep all follow-up visits as told by your health care provider. This is important. How is this prevented?  Talk with your health care provider about getting the HPV vaccines. These vaccines prevent some HPV infections and cancers. The vaccines are recommended for males and females between the ages of 75 and 35. They will not work if you already have HPV, and they are not recommended for pregnant women.  After treatment, use condoms during sex to prevent future infections.  Have only one sex partner.  Have a sex partner who does not have other sex partners.  Get regular Pap tests as directed by your health care provider. Contact a health care provider if:  The treated skin becomes red, swollen, or painful.  You have a fever.  You feel generally ill.  You feel lumps or pimples sticking out in and around your genital area.  You develop bleeding of the vagina or the treatment area.  You have painful sexual intercourse. Summary  Human papillomavirus (HPV) is the most common sexually transmitted infection (STI) and is highly contagious.  Most people carrying HPV do not have any symptoms.  HPV can be prevented with vaccination. The vaccine is recommended for males and females between the ages of 52 and 76.  There is no treatment for the virus itself. However, there are treatments for the health problems and symptoms HPV can cause. This information is not intended to replace advice given to you by your health care provider. Make sure you discuss any questions you have with your health care provider. Document Released: 08/23/2003 Document Revised: 05/11/2016 Document Reviewed: 05/11/2016 Elsevier Interactive Patient Education  2017 [employer]   Colposcopy, Care After This  sheet gives you information about how to care for yourself after your procedure. Your health care provider may also give you more specific instructions. If you have problems or questions, contact your health care provider. What can I expect after the procedure? If you had a colposcopy without a biopsy, you can expect to feel fine right away, but you may have some spotting for a few days. You can go back to your normal activities. If you had a colposcopy with a biopsy, it is common to have:  Soreness and pain. This may last for a few days.  Light-headedness.  Mild vaginal bleeding or dark-colored, grainy discharge. This may last for a few days. The discharge may be due to a solution that was used during the procedure. You may need to wear a sanitary pad during this time.  Spotting for at least 48 hours after the procedure.  Follow these instructions at home:  Take over-the-counter and prescription medicines only as told by your health care provider. Talk with your health care provider about what type of over-the-counter pain medicine and prescription medicine you can start  taking again. It is especially important to talk with your health care provider if you take blood-thinning medicine.  Do not drive or use heavy machinery while taking prescription pain medicine.  For at least 3 days after your procedure, or as long as told by your health care provider, avoid: ? Douching. ? Using tampons. ? Having sexual intercourse.  Continue to use birth control (contraception).  Limit your physical activity for the first day after the procedure as told by your health care provider. Ask your health care provider what activities are safe for you.  It is up to you to get the results of your procedure. Ask your health care provider, or the department performing the procedure, when your results will be ready.  Keep all follow-up visits as told by your health care provider. This is important. Contact a  health care provider if:  You develop a skin rash. Get help right away if:  You are bleeding heavily from your vagina or you are passing blood clots. This includes using more than one sanitary pad per hour for 2 hours in a row.  You have a fever or chills.  You have pelvic pain.  You have abnormal, yellow-colored, or bad-smelling vaginal discharge. This could be a sign of infection.  You have severe pain or cramps in your lower abdomen that do not get better with medicine.  You feel light-headed or dizzy, or you faint. Summary  If you had a colposcopy without a biopsy, you can expect to feel fine right away, but you may have some spotting for a few days. You can go back to your normal activities.  If you had a colposcopy with a biopsy, you may notice mild pain and spotting for 48 hours after the procedure.  Avoid douching, using tampons, and having sexual intercourse for 3 days after the procedure or as long as told by your health care provider.  Contact your health care provider if you have bleeding, severe pain, or signs of infection. This information is not intended to replace advice given to you by your health care provider. Make sure you discuss any questions you have with your health care provider. Document Released: 03/23/2013 Document Revised: 01/18/2016 Document Reviewed: 01/18/2016 Elsevier Interactive Patient Education  2018 [employer].

## 2017-01-30 NOTE — Progress Notes (Signed)
    CADANCE SCHUMAN 1990/10/09 [id number]        26 y.o.  G2P0010  Stable boyfriend  RP:  ASCUS/HPV HR pos  HPI:  Pap normal 10/2013, except Trichomonas.  STI screen neg 2017.  Using condoms most of the time for contraception.  Menses regular normal every month.  LMP 01/15/2017.  No pelvic pain.  Normal vaginal secretions. Had received Gardasil.  Past medical history,surgical history, problem list, medications, allergies, family history and social history were all reviewed and documented in the EPIC chart.  Directed ROS with pertinent positives and negatives documented in the history of present illness/assessment and plan.  Exam:  Vitals:   01/30/17 1204  BP: 118/74   General appearance:  Normal Colposcopy Procedure Note ZEA NAIK 01/30/2017  Indications:  ASCUS/HPV HR pos  Procedure Details  The risks and benefits of the procedure and Verbal informed consent obtained.  Speculum placed in vagina and excellent visualization of cervix achieved, cervix swabbed x 3 with acetic acid solution.  Findings:  Cervix colposcopy:    Physical Exam  Genitourinary:      Vaginal colposcopy:  Normal   Vulvar colposcopy:  Grossly normal  Perirectal colposcopy: Grossly normal   Specimens: HPV 16-18-45.  Gono-Chlam. Cervical Bx 6 and 12 O'clock.  Complications:  None, Silver Nitrate/Moncel for hemostasis. .  Plan: Pending results, management per results.   Assessment/Plan:  26 y.o. G2P0010   1. ASCUS with positive high risk HPV cervical Colposopy as described above.  Pending HPV 16-18-45 and Cervical Bxs.  Had received Gardasil.  Probable Cervical Dysplasia.  Management per results.  2. Screening examination for venereal disease STI screen.  Declines blood work for full STI screen. - GC/Chlamydia Probe Amp  Genia Del MD, 12:14 PM 01/30/2017

## 2017-01-31 LAB — GC/CHLAMYDIA PROBE AMP
CT PROBE, AMP APTIMA: NOT DETECTED
GC Probe RNA: NOT DETECTED

## 2017-02-02 LAB — HPV TYPE 16 AND 18/45 RNA
HPV TYPE 18/45 RNA: NOT DETECTED
HPV Type 16 RNA: NOT DETECTED

## 2017-02-03 LAB — PATHOLOGY

## 2017-05-29 ENCOUNTER — Encounter: Payer: Self-pay | Admitting: Obstetrics & Gynecology

## 2017-05-29 ENCOUNTER — Ambulatory Visit: Payer: PRIVATE HEALTH INSURANCE | Admitting: Obstetrics & Gynecology

## 2017-05-29 VITALS — BP 136/82

## 2017-05-29 DIAGNOSIS — R87810 Cervical high risk human papillomavirus (HPV) DNA test positive: Secondary | ICD-10-CM | POA: Diagnosis not present

## 2017-05-29 DIAGNOSIS — N870 Mild cervical dysplasia: Secondary | ICD-10-CM | POA: Diagnosis not present

## 2017-05-29 DIAGNOSIS — N898 Other specified noninflammatory disorders of vagina: Secondary | ICD-10-CM

## 2017-05-29 LAB — WET PREP FOR TRICH, YEAST, CLUE

## 2017-05-29 MED ORDER — FLUCONAZOLE 150 MG PO TABS: 150.0000 mg | ORAL_TABLET | Freq: Every day | ORAL | 1 refills | Status: AC

## 2017-05-29 NOTE — Addendum Note (Signed)
Addended by: Berna SpareASTILLO, BLANCA A on: 05/29/2017 02:57 PM   Modules accepted: Orders

## 2017-05-29 NOTE — Progress Notes (Signed)
    Crystal Barton 02/26/1991 [id number]        26 y.o.  G2P0010   RP: Follow-up Pap test at 4 months  HPI: CIN 1 on cervical biopsy at colposcopy August 2018.  HPV high risk positive, but HPV 16, 18 and 45 negative.  Patient feels that her vaginal secretions are mildly increased.  No itching or odor currently.  No urinary tract infection symptoms.  Past medical history,surgical history, problem list, medications, allergies, family history and social history were all reviewed and documented in the EPIC chart.  Directed ROS with pertinent positives and negatives documented in the history of present illness/assessment and plan.  Exam:  Vitals:   05/29/17 1413  BP: 136/82   General appearance:  Normal  GYN exam: Vulva normal.  Speculum: Cervix normal, vagina normal.  Pap reflex done.  Mild increase in vaginal discharge.  Wet prep done.  Wet prep:  Yeasts present   Assessment/Plan:  26 y.o. G2P0010   1. 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  HPV 16, 18 and 45 were negative.  Pap reflex repeated today.  Management per result.  2. Cervical high risk HPV (human papillomavirus) test positive  3. Vaginal discharge Wet prep showing yeasts.  Fluconazole 150 mg/tab. 1 tablet per mouth daily for 3 days prescribed.  Patient advised to use probiotic tablet vaginally every week as needed for prevention in the future. - WET PREP FOR TRICH, YEAST, CLUE  Other orders - fluconazole (DIFLUCAN) 150 MG tablet; Take 1 tablet (150 mg total) by mouth daily for 3 days.  Counseling on above issues more than 50% for 15 minutes.  Genia DelMarie-Lyne Darin Arndt MD, 2:34 PM 05/29/2017

## 2017-05-29 NOTE — Patient Instructions (Signed)
1. 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  HPV 16, 18 and 45 were negative.  Pap reflex repeated today.  Management per result.  2. Cervical high risk HPV (human papillomavirus) test positive  3. Vaginal discharge Wet prep showing yeasts.  Fluconazole 150 mg/tab. 1 tablet per mouth daily for 3 days prescribed.  Patient advised to use probiotic tablet vaginally every week as needed for prevention in the future. - WET PREP FOR TRICH, YEAST, CLUE  Other orders - fluconazole (DIFLUCAN) 150 MG tablet; Take 1 tablet (150 mg total) by mouth daily for 3 days.  [place], good seeing you today!  I will inform you of your results as soon as available.

## 2017-06-02 ENCOUNTER — Ambulatory Visit: Payer: PRIVATE HEALTH INSURANCE | Admitting: Obstetrics & Gynecology

## 2017-06-03 LAB — PAP IG W/ RFLX HPV ASCU

## 2017-06-03 LAB — HUMAN PAPILLOMAVIRUS, HIGH RISK: HPV DNA High Risk: DETECTED — AB

## 2017-09-22 ENCOUNTER — Ambulatory Visit: Payer: PRIVATE HEALTH INSURANCE | Admitting: Women's Health

## 2017-09-22 ENCOUNTER — Encounter: Payer: Self-pay | Admitting: Women's Health

## 2017-09-22 VITALS — BP 118/80

## 2017-09-22 DIAGNOSIS — Z113 Encounter for screening for infections with a predominantly sexual mode of transmission: Secondary | ICD-10-CM | POA: Diagnosis not present

## 2017-09-22 NOTE — Progress Notes (Signed)
lab

## 2017-09-22 NOTE — Progress Notes (Signed)
27 year old SPF G0 presents for STD screen.  Partner of 2 years unfaithful and requesting STD screen.  Denies vaginal discharge, abdominal pain, urinary symptoms or fever.  Monthly cycle/condoms inconsistently.  Exam: Appears well.  Abdomen obese, nontender.  External genitalia within normal limits no visible discharge, erythema, speculum exam no discharge or erythema noted, GC/chlamydia culture taken.  Bimanual no CMT or adnexal tenderness.  STD screen  Plan: GC/Chlamydia culture pending, HIV, hep B, C, RPR.  Reviewed importance of condoms until permanent partner.  Declines other contraception.

## 2017-09-23 LAB — C. TRACHOMATIS/N. GONORRHOEAE RNA
C. trachomatis RNA, TMA: NOT DETECTED
N. gonorrhoeae RNA, TMA: NOT DETECTED

## 2017-09-24 LAB — HEPATITIS C ANTIBODY
Hepatitis C Ab: NONREACTIVE
SIGNAL TO CUT-OFF: 0.15 (ref <1.00)

## 2017-09-24 LAB — HEPATITIS B SURFACE ANTIGEN: Hepatitis B Surface Ag: NONREACTIVE

## 2017-09-24 LAB — RPR: RPR: NONREACTIVE

## 2017-09-24 LAB — HIV ANTIBODY (ROUTINE TESTING W REFLEX): HIV 1&2 Ab, 4th Generation: NONREACTIVE

## 2017-09-29 ENCOUNTER — Telehealth: Payer: Self-pay | Admitting: *Deleted

## 2017-09-29 NOTE — Telephone Encounter (Signed)
Patient informed with negative STD panel done on 09/22/17

## 2017-12-01 ENCOUNTER — Emergency Department (HOSPITAL_BASED_OUTPATIENT_CLINIC_OR_DEPARTMENT_OTHER)
Admission: EM | Admit: 2017-12-01 | Discharge: 2017-12-01 | Disposition: A | Discharge status: Home / Self Care | Payer: PRIVATE HEALTH INSURANCE | Attending: Emergency Medicine | Admitting: Emergency Medicine

## 2017-12-01 ENCOUNTER — Encounter (HOSPITAL_BASED_OUTPATIENT_CLINIC_OR_DEPARTMENT_OTHER): Payer: Self-pay | Admitting: Emergency Medicine

## 2017-12-01 ENCOUNTER — Other Ambulatory Visit: Payer: Self-pay

## 2017-12-01 DIAGNOSIS — F17210 Nicotine dependence, cigarettes, uncomplicated: Secondary | ICD-10-CM | POA: Diagnosis not present

## 2017-12-01 DIAGNOSIS — M79674 Pain in right toe(s): Secondary | ICD-10-CM | POA: Insufficient documentation

## 2017-12-01 MED ORDER — CEPHALEXIN 500 MG PO CAPS: 500.0000 mg | ORAL_CAPSULE | Freq: Three times a day (TID) | ORAL | 0 refills | Status: DC

## 2017-12-01 MED FILL — CEPHALEXIN 500 MG CAPSULE: 500 | 7 days supply | Qty: 21 | Fill #0

## 2017-12-01 NOTE — Discharge Instructions (Addendum)
Return to the ER if not improving  Soak your foot 3 times a day in warm water until improved  Take the antibiotics as instructed

## 2017-12-01 NOTE — ED Triage Notes (Signed)
Patient states that she has had pain and swelling to her right big toe x 2 days

## 2017-12-01 NOTE — ED Notes (Signed)
Patient has noted area of redness around the nail bed of her right big toe. Pain noted with touching toe with patient grimacing

## 2017-12-01 NOTE — ED Provider Notes (Signed)
[facility] EMERGENCY DEPARTMENT Provider Note   CSN: [id number] Arrival date & time: 12/01/17  4098     History   Chief Complaint Chief Complaint  Patient presents with  . Nail Problem    HPI Crystal Barton is a 27 y.o. female.  HPI 27 year old female presents the emergency department with moderate pain in her right great toe which is throbbing in nature.  She states there is some mild irritation around her great toenail.  No fevers or chills.  No other complaints.  She reports mild swelling of her right great toe.  No injury or trauma.   Past Medical History:  Diagnosis Date  . STD (sexually transmitted disease)    Chlamydia    Patient Active Problem List   Diagnosis Date Noted  . Dysmenorrhea 01/02/2016  . Morbid obesity (HCC) 11/02/2013    History reviewed. No pertinent surgical history.   OB History    Gravida  2   Para      Term      Preterm      AB  1   Living  0     SAB  1   TAB      Ectopic      Multiple      Live Births               Home Medications    Prior to Admission medications   Medication Sig Start Date End Date Taking? Authorizing Provider  cephALEXin (KEFLEX) 500 MG capsule Take 1 capsule (500 mg total) by mouth 3 (three) times daily. 12/01/17   Azalia Bilis, MD    Family History History reviewed. No pertinent family history.  Social History Social History   Tobacco Use  . Smoking status: Current Every Day Smoker    Types: Cigarettes  . Smokeless tobacco: Never Used  Substance Use Topics  . Alcohol use: Yes    Alcohol/week: 0.0 oz    Comment: Occas  . Drug use: No     Allergies   Patient has no known allergies.   Review of Systems Review of Systems  All other systems reviewed and are negative.    Physical Exam Updated Vital Signs BP (!) 147/94 (BP Location: Left Arm)   Pulse 99   Temp 98.2 F (36.8 C) (Oral)   Resp 18   Ht 5\' 8"  (1.727 m)   Wt 117.9 kg (260 lb)   LMP 11/14/2017    SpO2 100%   BMI 39.53 kg/m   Physical Exam  Constitutional: She is oriented to person, place, and time. She appears well-developed and well-nourished.  HENT:  Head: Normocephalic.  Eyes: EOM are normal.  Neck: Normal range of motion.  Pulmonary/Chest: Effort normal.  Abdominal: She exhibits no distension.  Musculoskeletal: Normal range of motion.  Mild erythema of the paronychia of her right great toe without obvious fluctuance.  Mild ingrown toenails bilaterally with what appears to be chronic inflammation  Neurological: She is alert and oriented to person, place, and time.  Psychiatric: She has a normal mood and affect.  Nursing note and vitals reviewed.    ED Treatments / Results  Labs (all labs ordered are listed, but only abnormal results are displayed) Labs Reviewed - No data to display  EKG None  Radiology No results found.  Procedures Procedures (including critical care time)  Medications Ordered in ED Medications - No data to display   Initial Impression / Assessment and Plan / ED Course  I have reviewed the triage vital signs and the nursing notes.  Pertinent labs & imaging results that were available during my care of the patient were reviewed by me and considered in my medical decision making (see chart for details).     At this time we will try more of a conservative approach with antibiotics and warm water soaks.  If she does not improve she will likely need incision and drainage of a developing paronychia.  At this time there is no enough fluctuance for me to want to incise.  She understands to return for worsening symptoms at which point she will likely require incision and drainage of a paronychia.  Final Clinical Impressions(s) / ED Diagnoses   Final diagnoses:  Great toe pain, right    ED Discharge Orders        Ordered    cephALEXin (KEFLEX) 500 MG capsule  3 times daily     12/01/17 0857       Azalia Bilisampos, Mac Dowdell, MD 12/01/17 [id number]

## 2017-12-16 ENCOUNTER — Encounter: Payer: PRIVATE HEALTH INSURANCE | Admitting: Women's Health

## 2017-12-16 DIAGNOSIS — Z0289 Encounter for other administrative examinations: Secondary | ICD-10-CM

## 2018-01-25 ENCOUNTER — Other Ambulatory Visit: Payer: Self-pay

## 2018-01-25 ENCOUNTER — Encounter (HOSPITAL_COMMUNITY): Payer: Self-pay

## 2018-01-25 ENCOUNTER — Ambulatory Visit (HOSPITAL_COMMUNITY)
Admission: EM | Admit: 2018-01-25 | Discharge: 2018-01-25 | Disposition: A | Discharge status: Home / Self Care | Payer: PRIVATE HEALTH INSURANCE | Attending: Family Medicine | Admitting: Family Medicine

## 2018-01-25 DIAGNOSIS — Z113 Encounter for screening for infections with a predominantly sexual mode of transmission: Secondary | ICD-10-CM | POA: Diagnosis not present

## 2018-01-25 DIAGNOSIS — Z202 Contact with and (suspected) exposure to infections with a predominantly sexual mode of transmission: Secondary | ICD-10-CM | POA: Insufficient documentation

## 2018-01-25 DIAGNOSIS — N760 Acute vaginitis: Secondary | ICD-10-CM

## 2018-01-25 DIAGNOSIS — Z3202 Encounter for pregnancy test, result negative: Secondary | ICD-10-CM

## 2018-01-25 DIAGNOSIS — A549 Gonococcal infection, unspecified: Secondary | ICD-10-CM | POA: Diagnosis not present

## 2018-01-25 DIAGNOSIS — N946 Dysmenorrhea, unspecified: Secondary | ICD-10-CM | POA: Diagnosis not present

## 2018-01-25 DIAGNOSIS — A749 Chlamydial infection, unspecified: Secondary | ICD-10-CM | POA: Insufficient documentation

## 2018-01-25 DIAGNOSIS — N898 Other specified noninflammatory disorders of vagina: Secondary | ICD-10-CM | POA: Diagnosis not present

## 2018-01-25 DIAGNOSIS — F17210 Nicotine dependence, cigarettes, uncomplicated: Secondary | ICD-10-CM | POA: Diagnosis not present

## 2018-01-25 DIAGNOSIS — Z711 Person with feared health complaint in whom no diagnosis is made: Secondary | ICD-10-CM

## 2018-01-25 MED ORDER — CEFTRIAXONE SODIUM 250 MG IJ SOLR
INTRAMUSCULAR | Status: AC
Start: 2018-01-25 — End: ?
  Filled 2018-01-25: qty 250

## 2018-01-25 MED ORDER — AZITHROMYCIN 250 MG PO TABS
1000.0000 mg | ORAL_TABLET | Freq: Once | ORAL | Status: AC
  Administered 2018-01-25: 1000 mg via ORAL

## 2018-01-25 MED ORDER — METRONIDAZOLE 500 MG PO TABS: 500.0000 mg | ORAL_TABLET | Freq: Two times a day (BID) | ORAL | 0 refills | Status: AC

## 2018-01-25 MED ORDER — CEFTRIAXONE SODIUM 250 MG IJ SOLR
250.0000 mg | Freq: Once | INTRAMUSCULAR | Status: AC
  Administered 2018-01-25: 250 mg via INTRAMUSCULAR

## 2018-01-25 MED ORDER — AZITHROMYCIN 250 MG PO TABS
ORAL_TABLET | ORAL | Status: AC
  Filled 2018-01-25: qty 4

## 2018-01-25 NOTE — Discharge Instructions (Signed)
We have treated you today for gonorrhea and chlamydia.  We will also treat for bacterial vaginosis. Do not drink alcohol while taking this medication.  Will notify you of any positive findings and if any changes to treatment are needed.   Please withhold from intercourse for the next week. Please use condoms to prevent STD's.

## 2018-01-25 NOTE — ED Triage Notes (Signed)
[facility], pt states she had sexual relations on Friday night 01/23/18 and know she is experiencing light yellow discharge along with pain and pressure in the pelvic area since yesterday 01/24/18.

## 2018-01-25 NOTE — ED Provider Notes (Signed)
[facility]    CSN: [id number] Arrival date & time: 01/25/18  1621     History   Chief Complaint Chief Complaint  Patient presents with  . SEXUALLY TRANSMITTED DISEASE    HPI Crystal Barton is a 27 y.o. female.   Crystal Barton presents with complaints of vaginal discharge and vaginal irritation which started yesterday. No itching. No bleeding. lmp 7/16, periods are regular. No on birth control. No abdominal pain or pelvic pain. No back pain. No nausea or vomiting, no fevers. No urinary symptoms. Sexually active with 1 partner. States they just got back together and she is concerned about his potential other partners while they were apart. Do not use condoms. No sores or lesions. Hx of chlamydia.    ROS per HPI.      Past Medical History:  Diagnosis Date  . STD (sexually transmitted disease)    Chlamydia    Patient Active Problem List   Diagnosis Date Noted  . Dysmenorrhea 01/02/2016  . Morbid obesity (HCC) 11/02/2013    History reviewed. No pertinent surgical history.  OB History    Gravida  2   Para      Term      Preterm      AB  1   Living  0     SAB  1   TAB      Ectopic      Multiple      Live Births               Home Medications    Prior to Admission medications   Medication Sig Start Date End Date Taking? Authorizing Provider  phentermine 37.5 MG capsule TAKE 1 CAPSULE BY MOUTH ONCE DAILY IN THE MORNING 12/16/17  Yes [provider]  cephALEXin (KEFLEX) 500 MG capsule Take 1 capsule (500 mg total) by mouth 3 (three) times daily. 12/01/17   Azalia Bilisampos, Kevin, MD  metroNIDAZOLE (FLAGYL) 500 MG tablet Take 1 tablet (500 mg total) by mouth 2 (two) times daily for 7 days. 01/25/18 02/01/18  Georgetta HaberBurky, Natalie B, NP    Family History History reviewed. No pertinent family history.  Social History Social History   Tobacco Use  . Smoking status: Current Every Day Smoker    Types: Cigarettes  . Smokeless tobacco: Never Used    Substance Use Topics  . Alcohol use: Yes    Alcohol/week: 0.0 standard drinks    Comment: Occas  . Drug use: No     Allergies   Patient has no known allergies.   Review of Systems Review of Systems   Physical Exam Triage Vital Signs ED Triage Vitals  Enc Vitals Group     BP 01/25/18 1749 (!) 130/92     Pulse Rate 01/25/18 1749 93     Resp 01/25/18 1749 18     Temp 01/25/18 1749 98.5 F (36.9 C)     Temp Source 01/25/18 1749 Oral     SpO2 01/25/18 1749 100 %     Weight --      Height --      Head Circumference --      Peak Flow --      Pain Score 01/25/18 1751 0     Pain Loc --      Pain Edu? --      Excl. in GC? --    No data found.  Updated Vital Signs BP (!) 130/92 (BP Location: Left Arm)   Pulse 93  Temp 98.5 F (36.9 C) (Oral)   Resp 18   LMP 12/29/2017 (Exact Date)   SpO2 100%    Physical Exam  Constitutional: She is oriented to person, place, and time. She appears well-developed and well-nourished. No distress.  Cardiovascular: Normal rate, regular rhythm and normal heart sounds.  Pulmonary/Chest: Effort normal and breath sounds normal.  Abdominal: Soft. There is no tenderness. There is no rigidity, no rebound, no guarding and no CVA tenderness.  Genitourinary:  Genitourinary Comments: Denies sores, lesions, bleeding, pelvic pain; gu exam deferred; patient collected self swab   Neurological: She is alert and oriented to person, place, and time.  Skin: Skin is warm and dry.     UC Treatments / Results  Labs (all labs ordered are listed, but only abnormal results are displayed) Labs Reviewed - No data to display  EKG None  Radiology No results found.  Procedures Procedures (including critical care time)  Medications Ordered in UC Medications  azithromycin (ZITHROMAX) tablet 1,000 mg (has no administration in time range)  cefTRIAXone (ROCEPHIN) injection 250 mg (has no administration in time range)    Initial Impression /  Assessment and Plan / UC Course  I have reviewed the triage vital signs and the nursing notes.  Pertinent labs & imaging results that were available during my care of the patient were reviewed by me and considered in my medical decision making (see chart for details).     Patient with concern for std exposure, symptomatic. Empiric azithromycin and rocephin provided. Flagyl provided. Vaginal cytology provided. Will notify of any positive findings and if any changes to treatment are needed.  Encouraged safe sex practices. If symptoms worsen or do not improve in the next week to return to be seen or to follow up with PCP.  Patient verbalized understanding and agreeable to plan.    Final Clinical Impressions(s) / UC Diagnoses   Final diagnoses:  Acute vaginitis  Concern about STD in female without diagnosis     Discharge Instructions     We have treated you today for gonorrhea and chlamydia.  We will also treat for bacterial vaginosis. Do not drink alcohol while taking this medication.  Will notify you of any positive findings and if any changes to treatment are needed.   Please withhold from intercourse for the next week. Please use condoms to prevent STD's.     ED Prescriptions    Medication Sig Dispense Auth. Provider   metroNIDAZOLE (FLAGYL) 500 MG tablet Take 1 tablet (500 mg total) by mouth 2 (two) times daily for 7 days. 14 tablet Georgetta HaberBurky, Natalie B, NP     Controlled Substance Prescriptions [place] Controlled Substance Registry consulted? Not Applicable   Georgetta HaberBurky, Natalie B, NP 01/25/18 1819

## 2018-01-26 LAB — POCT URINALYSIS DIP (DEVICE)
BILIRUBIN URINE: NEGATIVE
GLUCOSE, UA: NEGATIVE mg/dL
Ketones, ur: NEGATIVE mg/dL
NITRITE: NEGATIVE
PH: 6.5 (ref 5.0–8.0)
PROTEIN: 30 mg/dL — AB
Specific Gravity, Urine: >=1.03 (ref 1.005–1.030)
Urobilinogen, UA: 1 mg/dL (ref 0.0–1.0)

## 2018-01-26 LAB — CERVICOVAGINAL ANCILLARY ONLY
Bacterial vaginitis: NEGATIVE
Chlamydia: NEGATIVE
NEISSERIA GONORRHEA: NEGATIVE
TRICH (WINDOWPATH): NEGATIVE

## 2018-01-26 LAB — POCT PREGNANCY, URINE: PREG TEST UR: NEGATIVE

## 2018-01-31 ENCOUNTER — Emergency Department (HOSPITAL_BASED_OUTPATIENT_CLINIC_OR_DEPARTMENT_OTHER)
Admission: EM | Admit: 2018-01-31 | Discharge: 2018-01-31 | Disposition: A | Discharge status: Home / Self Care | Payer: PRIVATE HEALTH INSURANCE | Attending: Emergency Medicine | Admitting: Emergency Medicine

## 2018-01-31 ENCOUNTER — Other Ambulatory Visit: Payer: Self-pay

## 2018-01-31 ENCOUNTER — Encounter (HOSPITAL_BASED_OUTPATIENT_CLINIC_OR_DEPARTMENT_OTHER): Payer: Self-pay | Admitting: Adult Health

## 2018-01-31 DIAGNOSIS — F17210 Nicotine dependence, cigarettes, uncomplicated: Secondary | ICD-10-CM | POA: Insufficient documentation

## 2018-01-31 DIAGNOSIS — R21 Rash and other nonspecific skin eruption: Secondary | ICD-10-CM

## 2018-01-31 DIAGNOSIS — N9089 Other specified noninflammatory disorders of vulva and perineum: Secondary | ICD-10-CM | POA: Diagnosis present

## 2018-01-31 NOTE — Discharge Instructions (Signed)
1.  Follow-up with your gynecologist for recheck of your genital rash.  Results of the tests done in the emergency department should be ready within 2 to 5 days. 2.  Return to the emergency department if your symptoms are worsening or changing. 3.  Do not shave your genital area.  Is only very mild soap and pat dry.  Avoid sexual contact until the results of your tests have returned.

## 2018-01-31 NOTE — ED Provider Notes (Signed)
[facility] EMERGENCY DEPARTMENT Provider Note   CSN: [id number] Arrival date & time: 01/31/18  1750     History   Chief Complaint Chief Complaint  Patient presents with  . Vaginal Itching    HPI Crystal Barton is a 27 y.o. female.  HPI Patient has developed small lesions on her genital area.  She reports the just noticed some several days ago.  She reports there is not an intense amount of itching there has been some.  Sores are slightly uncomfortable.  She does shave her genital area but has not shaved very recently. Past Medical History:  Diagnosis Date  . STD (sexually transmitted disease)    Chlamydia    Patient Active Problem List   Diagnosis Date Noted  . Dysmenorrhea 01/02/2016  . Morbid obesity (HCC) 11/02/2013    History reviewed. No pertinent surgical history.   OB History    Gravida  2   Para      Term      Preterm      AB  1   Living  0     SAB  1   TAB      Ectopic      Multiple      Live Births               Home Medications    Prior to Admission medications   Medication Sig Start Date End Date Taking? Authorizing Provider  metroNIDAZOLE (FLAGYL) 500 MG tablet Take 1 tablet (500 mg total) by mouth 2 (two) times daily for 7 days. 01/25/18 02/01/18 Yes Burky, Barron AlvineNatalie B, NP  cephALEXin (KEFLEX) 500 MG capsule Take 1 capsule (500 mg total) by mouth 3 (three) times daily. 12/01/17   Azalia Bilisampos, Kevin, MD  phentermine 37.5 MG capsule TAKE 1 CAPSULE BY MOUTH ONCE DAILY IN THE MORNING 12/16/17   [provider]    Family History History reviewed. No pertinent family history.  Social History Social History   Tobacco Use  . Smoking status: Current Every Day Smoker    Types: Cigarettes  . Smokeless tobacco: Never Used  Substance Use Topics  . Alcohol use: Yes    Alcohol/week: 0.0 standard drinks    Comment: Occas  . Drug use: No     Allergies   Patient has no known allergies.   Review of Systems Review of  Systems 10 Systems reviewed and are negative for acute change except as noted in the HPI.   Physical Exam Updated Vital Signs BP 133/88 (BP Location: Right Arm)   Pulse 93   Temp 99.4 F (37.4 C) (Oral)   Resp 18   Ht 5\' 6"  (1.676 m)   Wt 116.1 kg   LMP 01/31/2018 (Exact Date)   SpO2 100%   BMI 41.32 kg/m   Physical Exam  Constitutional: She is oriented to person, place, and time. She appears well-developed and well-nourished. No distress.  Eyes: EOM are normal.  Abdominal: Soft. She exhibits no distension. There is no tenderness. There is no guarding.  Genitourinary:  Genitourinary Comments: Approximately 5-7 3 mm, shallow erosions on the hairbearing areas of the labia majora adjacent to the mucosal surfaces.  Mucosal surfaces are free of erosions or lesions.  No active discharge from the vaginal introitus.  Musculoskeletal: Normal range of motion.  Neurological: She is alert and oriented to person, place, and time. She exhibits normal muscle tone. Coordination normal.  Skin: Skin is warm and dry.  Psychiatric: She has a  normal mood and affect.     ED Treatments / Results  Labs (all labs ordered are listed, but only abnormal results are displayed) Labs Reviewed  HERPES SIMPLEX VIRUS(HSV) DNA BY PCR  HSV(HERPES SIMPLEX VRS) I + II AB-IGG  HSV(HERPES SIMPLEX VRS) I + II AB-IGM    EKG None  Radiology No results found.  Procedures Procedures (including critical care time)  Medications Ordered in ED Medications - No data to display   Initial Impression / Assessment and Plan / ED Course  I have reviewed the triage vital signs and the nursing notes.  Pertinent labs & imaging results that were available during my care of the patient were reviewed by me and considered in my medical decision making (see chart for details).      Final Clinical Impressions(s) / ED Diagnoses   Final diagnoses:  Rash of genital area  Patient was empirically treated for STDs a week  ago.  GC chlamydia returned negative.  She does not have any complaints of abdominal pain or vaginal discharge.  Her only complaint is surface lesions on the hairbearing area of the labia majora.  Appearance is suggestive of possible HSV.  Patient however does not have significant amount of pain or itching associated.  These may be shallow erosions from shaving and scratching.  Patient was very concerned about possibility of HSV, testing has been done and patient counseled to avoid sexual contact and no shaving and only mild soaps to the vaginal area at this time.  ED Discharge Orders    None       Arby BarrettePfeiffer, Mc Hollen, MD 01/31/18 [phone]

## 2018-01-31 NOTE — ED Triage Notes (Signed)
PResents with vaginal rash and itching that began Thursday. [facility] SHe has taken all of her medications. She is still experiencing light green vaginal discharge.

## 2018-02-04 LAB — HSV(HERPES SIMPLEX VRS) I + II AB-IGG

## 2018-02-04 LAB — HSV(HERPES SIMPLEX VRS) I + II AB-IGM

## 2018-04-21 ENCOUNTER — Emergency Department (HOSPITAL_BASED_OUTPATIENT_CLINIC_OR_DEPARTMENT_OTHER)
Admission: EM | Admit: 2018-04-21 | Discharge: 2018-04-21 | Disposition: A | Discharge status: Home / Self Care | Payer: PRIVATE HEALTH INSURANCE | Attending: Emergency Medicine | Admitting: Emergency Medicine

## 2018-04-21 ENCOUNTER — Other Ambulatory Visit: Payer: Self-pay

## 2018-04-21 ENCOUNTER — Encounter (HOSPITAL_BASED_OUTPATIENT_CLINIC_OR_DEPARTMENT_OTHER): Payer: Self-pay | Admitting: Emergency Medicine

## 2018-04-21 ENCOUNTER — Emergency Department (HOSPITAL_BASED_OUTPATIENT_CLINIC_OR_DEPARTMENT_OTHER): Payer: PRIVATE HEALTH INSURANCE

## 2018-04-21 DIAGNOSIS — F17210 Nicotine dependence, cigarettes, uncomplicated: Secondary | ICD-10-CM | POA: Insufficient documentation

## 2018-04-21 DIAGNOSIS — R05 Cough: Secondary | ICD-10-CM | POA: Diagnosis present

## 2018-04-21 DIAGNOSIS — Z79899 Other long term (current) drug therapy: Secondary | ICD-10-CM | POA: Insufficient documentation

## 2018-04-21 DIAGNOSIS — J209 Acute bronchitis, unspecified: Secondary | ICD-10-CM

## 2018-04-21 MED ORDER — BENZONATATE 200 MG PO CAPS: 200.0000 mg | ORAL_CAPSULE | Freq: Three times a day (TID) | ORAL | 0 refills | Status: AC

## 2018-04-21 MED ORDER — ALBUTEROL SULFATE HFA 108 (90 BASE) MCG/ACT IN AERS
2.0000 | INHALATION_SPRAY | Freq: Once | RESPIRATORY_TRACT | Status: AC
  Administered 2018-04-21: 2 via RESPIRATORY_TRACT
  Filled 2018-04-21: qty 6.7

## 2018-04-21 MED ORDER — ACETAMINOPHEN 325 MG PO TABS
650.0000 mg | ORAL_TABLET | Freq: Once | ORAL | Status: AC
  Administered 2018-04-21: 650 mg via ORAL
  Filled 2018-04-21: qty 2

## 2018-04-21 MED FILL — BENZONATATE 200 MG CAPS: 200 | 10 days supply | Qty: 30 | Fill #0

## 2018-04-21 NOTE — ED Provider Notes (Signed)
[facility] EMERGENCY DEPARTMENT Provider Note   CSN: [id number] Arrival date & time: 04/21/18  1522     History   Chief Complaint Chief Complaint  Patient presents with  . Cough    HPI Crystal Barton is a 27 y.o. female.  27 year old female presents with complaint of cough, congestion, fever.  Patient states her symptoms started yesterday with a sinus congestion and intermittent sweats and chills.  Patient developed cough today with complaint of chest congestion, ongoing tactile fever (has not checked temp prior to arrival in the ER). Reports mild body aches, 1 episode of post tussive emesis. Patient took Sudafed with relief of her sinus congestion. 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 Patient is a daily smoker, does not vape, no history of asthma or chronic lung disease.      Past Medical History:  Diagnosis Date  . STD (sexually transmitted disease)    Chlamydia    Patient Active Problem List   Diagnosis Date Noted  . Dysmenorrhea 01/02/2016  . Morbid obesity (HCC) 11/02/2013    History reviewed. No pertinent surgical history.   OB History    Gravida  2   Para      Term      Preterm      AB  1   Living  0     SAB  1   TAB      Ectopic      Multiple      Live Births               Home Medications    Prior to Admission medications   Medication Sig Start Date End Date Taking? Authorizing Provider  phentermine 37.5 MG capsule TAKE 1 CAPSULE BY MOUTH ONCE DAILY IN THE MORNING 12/16/17  Yes [provider]  benzonatate (TESSALON) 200 MG capsule Take 1 capsule (200 mg total) by mouth every 8 (eight) hours for 10 days. 04/21/18 05/01/18  Jeannie Fend, PA-C    Family History No family history on file.  Social History Social History   Tobacco Use  . Smoking status: Current Every Day Smoker    Types: Cigarettes  . Smokeless tobacco: Never Used  Substance Use Topics  . Alcohol use: Yes    Alcohol/week:  0.0 standard drinks    Comment: Occas  . Drug use: No     Allergies   Patient has no known allergies.   Review of Systems Review of Systems  Constitutional: Positive for chills, diaphoresis and fever.  HENT: Positive for congestion and postnasal drip. Negative for ear pain, rhinorrhea, sinus pressure, sinus pain, sneezing and sore throat.   Eyes: Negative for discharge and redness.  Respiratory: Positive for cough. Negative for shortness of breath and wheezing.   Cardiovascular: Negative for chest pain.  Gastrointestinal: Positive for vomiting. Negative for constipation, diarrhea and nausea.  Genitourinary: Negative for dysuria and frequency.  Musculoskeletal: Positive for myalgias. Negative for joint swelling.  Skin: Negative for rash and wound.  Allergic/Immunologic: Negative for immunocompromised state.  Neurological: Negative for weakness and headaches.  Hematological: Negative for adenopathy.  Psychiatric/Behavioral: Negative for confusion.  All other systems reviewed and are negative.    Physical Exam Updated Vital Signs BP 125/88   Pulse (!) 116   Temp 98.2 F (36.8 C) (Oral)   Resp 16   Ht 5\' 6"  (1.676 m)   Wt 115.7 kg   LMP 03/30/2018   SpO2 100%   BMI 41.16  kg/m   Physical Exam  Constitutional: She is oriented to person, place, and time. She appears well-developed and well-nourished. No distress.  HENT:  Head: Normocephalic and atraumatic.  Nose: Mucosal edema present. Right sinus exhibits no maxillary sinus tenderness and no frontal sinus tenderness. Left sinus exhibits no maxillary sinus tenderness and no frontal sinus tenderness.  Mouth/Throat: Uvula is midline and mucous membranes are normal. No trismus in the jaw. Posterior oropharyngeal erythema present. No posterior oropharyngeal edema or tonsillar abscesses. Tonsils are 1+ on the right. Tonsils are 1+ on the left. No tonsillar exudate.  Eyes: Conjunctivae are normal.  Neck: Neck supple.    Cardiovascular: Regular rhythm, normal heart sounds and intact distal pulses. Tachycardia present.  No murmur heard. HR 110 RRR on recheck during exam.  Pulmonary/Chest: Effort normal. No respiratory distress. She has wheezes in the right upper field and the right lower field.  Lymphadenopathy:    She has no cervical adenopathy.  Neurological: She is alert and oriented to person, place, and time.  Skin: Skin is warm and dry. No rash noted. She is not diaphoretic.  Psychiatric: She has a normal mood and affect. Her behavior is normal.  Nursing note and vitals reviewed.    ED Treatments / Results  Labs (all labs ordered are listed, but only abnormal results are displayed) Labs Reviewed - No data to display  EKG None  Radiology Dg Chest 2 View  Result Date: 04/21/2018 CLINICAL DATA:  Cough and fever EXAM: CHEST - 2 VIEW COMPARISON:  None. FINDINGS: The heart size and mediastinal contours are within normal limits. Both lungs are clear. The visualized skeletal structures are unremarkable. IMPRESSION: No active cardiopulmonary disease. Electronically Signed   By: Marlan Palau M.D.   On: 04/21/2018 16:06    Procedures Procedures (including critical care time)  Medications Ordered in ED Medications  acetaminophen (TYLENOL) tablet 650 mg (650 mg Oral Given 04/21/18 1600)  albuterol (PROVENTIL HFA;VENTOLIN HFA) 108 (90 Base) MCG/ACT inhaler 2 puff (2 puffs Inhalation Given 04/21/18 1606)     Initial Impression / Assessment and Plan / ED Course  I have reviewed the triage vital signs and the nursing notes.  Pertinent labs & imaging results that were available during my care of the patient were reviewed by me and considered in my medical decision making (see chart for details).  Clinical Course as of Apr 21 1722  Wed Apr 21, 2018  2627 27 year old female presents with cough and congestion.  On exam patient had mild wheezing right upper and lower fields, mildly cardiac otherwise  well-appearing.  Patient's temperature was 100 and triage, she was given Tylenol.  Patient states she recently took Sudafed before coming in today.  Patient heart rate at time of discharge is right at 105 regular rate and rhythm, review of her previous vital signs shows her heart rate ranges from the 90s to low 100s.  Patient feels better after albuterol inhaler given while in the ER.  Her chest x-ray is clear, no evidence of pneumonia or bronchitis.  Recommend symptomatic treatment, she may continue with her Sudafed, discontinue if she develops palpitations, can take Zyrtec, Flonase, saline sinus rinse.  Given prescription for Tessalon.  Discharged home with the albuterol inhaler.  Recommend recheck with PCP in 2 days, return to ER for severe concerning symptoms.   [LM]    Clinical Course User Index [LM] Jeannie Fend, PA-C   Final Clinical Impressions(s) / ED Diagnoses   Final diagnoses:  Acute bronchitis,  unspecified organism    ED Discharge Orders         Ordered    benzonatate (TESSALON) 200 MG capsule  Every 8 hours     04/21/18 1707           Jeannie Fend, PA-C 04/21/18 1724    Virgina Norfolk, DO 04/21/18 2309

## 2018-04-21 NOTE — ED Triage Notes (Signed)
Nasal congestion with cough since yesterday.  Pt states also has fever.

## 2018-04-21 NOTE — Discharge Instructions (Signed)
Saline sinus rinse twice daily. Flonase and Zyrtec daily. Continue with Sudafed as directed. Tessalon as needed as prescribed for cough. Use the inhaler every 4-6 hours as needed for cough and wheezing. Recheck with your primary care doctor next week, return to ER for severe or concerning symptoms.

## 2018-04-21 NOTE — ED Notes (Signed)
Pt verbalizes understanding of d/c instructions and denies any further needs at this time. 

## 2018-08-23 ENCOUNTER — Emergency Department (HOSPITAL_BASED_OUTPATIENT_CLINIC_OR_DEPARTMENT_OTHER): Payer: PRIVATE HEALTH INSURANCE

## 2018-08-23 ENCOUNTER — Emergency Department (HOSPITAL_BASED_OUTPATIENT_CLINIC_OR_DEPARTMENT_OTHER)
Admission: EM | Admit: 2018-08-23 | Discharge: 2018-08-23 | Disposition: A | Discharge status: Home / Self Care | Payer: PRIVATE HEALTH INSURANCE | Attending: Emergency Medicine | Admitting: Emergency Medicine

## 2018-08-23 ENCOUNTER — Encounter (HOSPITAL_BASED_OUTPATIENT_CLINIC_OR_DEPARTMENT_OTHER): Payer: Self-pay | Admitting: *Deleted

## 2018-08-23 ENCOUNTER — Other Ambulatory Visit: Payer: Self-pay

## 2018-08-23 DIAGNOSIS — J111 Influenza due to unidentified influenza virus with other respiratory manifestations: Secondary | ICD-10-CM | POA: Diagnosis not present

## 2018-08-23 DIAGNOSIS — F17210 Nicotine dependence, cigarettes, uncomplicated: Secondary | ICD-10-CM | POA: Diagnosis not present

## 2018-08-23 DIAGNOSIS — R69 Illness, unspecified: Secondary | ICD-10-CM

## 2018-08-23 DIAGNOSIS — R509 Fever, unspecified: Secondary | ICD-10-CM | POA: Insufficient documentation

## 2018-08-23 DIAGNOSIS — R05 Cough: Secondary | ICD-10-CM | POA: Diagnosis not present

## 2018-08-23 DIAGNOSIS — M791 Myalgia, unspecified site: Secondary | ICD-10-CM | POA: Diagnosis not present

## 2018-08-23 DIAGNOSIS — R0981 Nasal congestion: Secondary | ICD-10-CM | POA: Diagnosis present

## 2018-08-23 LAB — INFLUENZA PANEL BY PCR (TYPE A & B)
Influenza A By PCR: POSITIVE — AB
Influenza B By PCR: NEGATIVE

## 2018-08-23 MED ORDER — BENZONATATE 100 MG PO CAPS: 100.0000 mg | ORAL_CAPSULE | Freq: Three times a day (TID) | ORAL | 0 refills | Status: DC | PRN

## 2018-08-23 MED ORDER — OSELTAMIVIR PHOSPHATE 75 MG PO CAPS: 75.0000 mg | ORAL_CAPSULE | Freq: Two times a day (BID) | ORAL | 0 refills | Status: DC

## 2018-08-23 MED ORDER — ACETAMINOPHEN 500 MG PO TABS
1000.0000 mg | ORAL_TABLET | Freq: Once | ORAL | Status: AC
  Administered 2018-08-23: 1000 mg via ORAL
  Filled 2018-08-23: qty 2

## 2018-08-23 MED ORDER — FLUTICASONE PROPIONATE 50 MCG/ACT NA SUSP: 2.0000 | Freq: Every day | NASAL | 0 refills | Status: DC

## 2018-08-23 NOTE — ED Triage Notes (Signed)
States has had fever since last pm  Has not checked temp or taken any meds,  States has been hot and chills since last pm  Cough,

## 2018-08-23 NOTE — Discharge Instructions (Signed)

## 2018-08-23 NOTE — ED Provider Notes (Signed)
Emergency Department Provider Note   I have reviewed the triage vital signs and the nursing notes.   HISTORY  Chief Complaint Fever   HPI Crystal Barton is a 28 y.o. female presents to the emergency department for evaluation of flulike symptoms starting yesterday.  Patient has had congestion, cough, fever, body aches.  She took over-the-counter medicines last night and recorded a max temp of 100.9.  She denies any abdominal pain, vomiting, diarrhea.  No dysuria, hesitancy, urgency.  No flank pain.  Patient has had a nonproductive cough.  She has not traveled internationally in the last 30 days and has not had known contact with COVID-19 patient.  Patient does work at a nursing home.  Past Medical History:  Diagnosis Date  . STD (sexually transmitted disease)    Chlamydia    Patient Active Problem List   Diagnosis Date Noted  . Dysmenorrhea 01/02/2016  . Morbid obesity (HCC) 11/02/2013    History reviewed. No pertinent surgical history.   Allergies Patient has no known allergies.  No family history on file.  Social History Social History   Tobacco Use  . Smoking status: Current Every Day Smoker    Types: Cigarettes  . Smokeless tobacco: Never Used  Substance Use Topics  . Alcohol use: Yes    Alcohol/week: 0.0 standard drinks    Comment: Occas  . Drug use: No    Review of Systems  Constitutional: Positive fever/chills and body aches.  Eyes: No visual changes. ENT: No sore throat. Positive congestion.  Cardiovascular: Denies chest pain. Respiratory: Denies shortness of breath. Positive cough.  Gastrointestinal: No abdominal pain.  No nausea, no vomiting.  No diarrhea.  No constipation. Genitourinary: Negative for dysuria. Musculoskeletal: Negative for back pain. Skin: Negative for rash. Neurological: Negative for headaches, focal weakness or numbness.  10-point ROS otherwise negative.  ____________________________________________   PHYSICAL  EXAM:  VITAL SIGNS: ED Triage Vitals  Enc Vitals Group     BP 08/23/18 0809 (!) 168/77     Pulse Rate 08/23/18 0809 (!) 111     Resp 08/23/18 0809 14     Temp 08/23/18 0809 98.8 F (37.1 C)     Temp Source 08/23/18 0809 Oral     SpO2 08/23/18 0809 97 %     Weight 08/23/18 0810 250 lb (113.4 kg)     Height 08/23/18 0810 5\' 7"  (1.702 m)     Pain Score 08/23/18 0814 0   Constitutional: Alert and oriented. Well appearing and in no acute distress. Eyes: Conjunctivae are normal.  Head: Atraumatic. Nose: Mild congestion/rhinnorhea. Mouth/Throat: Mucous membranes are moist.  Oropharynx with mild erythema. No exudate. Neck: No stridor.   Cardiovascular: Tachycardia. Good peripheral circulation. Grossly normal heart sounds.   Respiratory: Normal respiratory effort.  No retractions. Lungs CTAB. Gastrointestinal: Soft and nontender. No distention.  Musculoskeletal: No lower extremity tenderness nor edema. No gross deformities of extremities. Neurologic:  Normal speech and language. No gross focal neurologic deficits are appreciated.  Skin:  Skin is warm, dry and intact. No rash noted.  ____________________________________________   LABS (all labs ordered are listed, but only abnormal results are displayed)  Labs Reviewed  INFLUENZA PANEL BY PCR (TYPE A & B) - Abnormal; Notable for the following components:      Result Value   Influenza A By PCR POSITIVE (*)    All other components within normal limits   ____________________________________________  RADIOLOGY  Dg Chest 2 View  Result Date: 08/23/2018 CLINICAL DATA:  Pt having cough,fever and congestion for a day,smoker EXAM: CHEST - 2 VIEW COMPARISON:  04/21/2018 FINDINGS: Lungs are clear. Heart size and mediastinal contours are within normal limits. No effusion. Visualized bones unremarkable. IMPRESSION: No acute cardiopulmonary disease. Electronically Signed   By: Corlis Leak M.D.   On: 08/23/2018 08:40     ____________________________________________   PROCEDURES  Procedure(s) performed:   Procedures  None  ____________________________________________   INITIAL IMPRESSION / ASSESSMENT AND PLAN / ED COURSE  Pertinent labs & imaging results that were available during my care of the patient were reviewed by me and considered in my medical decision making (see chart for details).  Patient presents to the emergency department for evaluation of flulike symptoms that started yesterday.  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  Patient is low risk at this time for COVID-19 my suspicion for this is low.  I will send flu testing but will not get it back until later.  Discussed providing a Tamiflu prescription and patient will check the flu results on her phone later and start if positive.  Will obtain a chest x-ray.  Patient with mild tachycardia no hypoxemia or fever.  CXR negative. Plan for supportive care and PCP follow up.  ____________________________________________  FINAL CLINICAL IMPRESSION(S) / ED DIAGNOSES  Final diagnoses:  Influenza-like illness     MEDICATIONS GIVEN DURING THIS VISIT:  Medications  acetaminophen (TYLENOL) tablet 1,000 mg (1,000 mg Oral Given 08/23/18 0921)     NEW OUTPATIENT MEDICATIONS STARTED DURING THIS VISIT:  Discharge Medication List as of 08/23/2018  9:15 AM    START taking these medications   Details  benzonatate (TESSALON) 100 MG capsule Take 1 capsule (100 mg total) by mouth 3 (three) times daily as needed., Starting Mon 08/23/2018, Normal    fluticasone (FLONASE) 50 MCG/ACT nasal spray Place 2 sprays into both nostrils daily for 7 days., Starting Mon 08/23/2018, Until Mon 08/30/2018, Normal    oseltamivir (TAMIFLU) 75 MG capsule Take 1 capsule (75 mg total) by mouth every 12 (twelve) hours., Starting Mon 08/23/2018, Normal        Note:  This document was prepared using Dragon voice recognition software and may include  unintentional dictation errors.  Crystal Bene, MD Emergency Medicine    Crystal Barton, Crystal Repress, MD 08/23/18 2046

## 2018-09-10 ENCOUNTER — Other Ambulatory Visit: Payer: Self-pay

## 2018-09-14 ENCOUNTER — Ambulatory Visit: Payer: PRIVATE HEALTH INSURANCE | Admitting: Women's Health

## 2018-09-14 ENCOUNTER — Encounter: Payer: Self-pay | Admitting: Women's Health

## 2018-09-14 ENCOUNTER — Other Ambulatory Visit: Payer: Self-pay

## 2018-09-14 VITALS — BP 118/76 | Ht 67.0 in | Wt 258.0 lb

## 2018-09-14 DIAGNOSIS — L732 Hidradenitis suppurativa: Secondary | ICD-10-CM | POA: Diagnosis not present

## 2018-09-14 DIAGNOSIS — Z01419 Encounter for gynecological examination (general) (routine) without abnormal findings: Secondary | ICD-10-CM | POA: Diagnosis not present

## 2018-09-14 DIAGNOSIS — N898 Other specified noninflammatory disorders of vagina: Secondary | ICD-10-CM

## 2018-09-14 DIAGNOSIS — N760 Acute vaginitis: Secondary | ICD-10-CM | POA: Diagnosis not present

## 2018-09-14 DIAGNOSIS — N912 Amenorrhea, unspecified: Secondary | ICD-10-CM

## 2018-09-14 DIAGNOSIS — B9689 Other specified bacterial agents as the cause of diseases classified elsewhere: Secondary | ICD-10-CM

## 2018-09-14 LAB — CBC WITH DIFFERENTIAL/PLATELET
ABSOLUTE MONOCYTES: 632 {cells}/uL (ref 200–950)
BASOS PCT: 0.3 %
Basophils Absolute: 21 cells/uL (ref 0–200)
EOS ABS: 92 {cells}/uL (ref 15–500)
Eosinophils Relative: 1.3 %
HCT: 37.4 % (ref 35.0–45.0)
Hemoglobin: 12.5 g/dL (ref 11.7–15.5)
Lymphs Abs: 2698 cells/uL (ref 850–3900)
MCH: 28.5 pg (ref 27.0–33.0)
MCHC: 33.4 g/dL (ref 32.0–36.0)
MCV: 85.4 fL (ref 80.0–100.0)
MPV: 10.3 fL (ref 7.5–12.5)
Monocytes Relative: 8.9 %
NEUTROS ABS: 3657 {cells}/uL (ref 1500–7800)
Neutrophils Relative %: 51.5 %
PLATELETS: 316 10*3/uL (ref 140–400)
RBC: 4.38 10*6/uL (ref 3.80–5.10)
RDW: 13 % (ref 11.0–15.0)
TOTAL LYMPHOCYTE: 38 %
WBC: 7.1 10*3/uL (ref 3.8–10.8)

## 2018-09-14 LAB — WET PREP FOR TRICH, YEAST, CLUE

## 2018-09-14 LAB — PREGNANCY, URINE: Preg Test, Ur: NEGATIVE

## 2018-09-14 MED ORDER — METRONIDAZOLE 500 MG PO TABS: 500.0000 mg | ORAL_TABLET | Freq: Two times a day (BID) | ORAL | 0 refills | Status: DC

## 2018-09-14 MED ORDER — DOXYCYCLINE HYCLATE 100 MG PO CAPS: 100.0000 mg | ORAL_CAPSULE | Freq: Two times a day (BID) | ORAL | 0 refills | Status: DC

## 2018-09-14 NOTE — Addendum Note (Signed)
Addended by: Dayna Barker on: 09/14/2018 03:36 PM   Modules accepted: Orders

## 2018-09-14 NOTE — Progress Notes (Signed)
Crystal Barton January 21, 1991 [id number]    History:    Presents for annual exam.  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  Gardasil series completed.  Same partner negative STD screen 09/2017.  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  Monthly cycle/condoms, condom fell off last month, cycle due now.  Past medical history, past surgical history, family history and social history were all reviewed and documented in the EPIC chart.  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  Parents healthy.  ROS:  A ROS was performed and pertinent positives and negatives are included.  Exam:  Vitals:   09/14/18 1431  BP: 118/76  Weight: 258 lb (117 kg)  Height: 5\' 7"  (1.702 m)   Body mass index is 40.41 kg/m.   General appearance:  Normal Thyroid:  Symmetrical, normal in size, without palpable masses or nodularity. Respiratory  Auscultation:  Clear without wheezing or rhonchi Cardiovascular  Auscultation:  Regular rate, without rubs, murmurs or gallops  Edema/varicosities:  Not grossly evident Abdominal  Soft,nontender, without masses, guarding or rebound.  Liver/spleen:  No organomegaly noted  Hernia:  None appreciated  Skin  Inspection:  Grossly normal left axilla 3 cm hidradenitis, tender   Breasts: Examined lying and sitting.     Right: Without masses, retractions, discharge or axillary adenopathy.     Left: Without masses, retractions, discharge or axillary adenopathy. Gentitourinary   Inguinal/mons:  Normal without inguinal adenopathy  External genitalia:  Normal  BUS/Urethra/Skene's glands:  Normal  Vagina:  Normal  Cervix:  Normal  Uterus:   normal in size, shape and contour.  Midline and mobile  Adnexa/parametria:     Rt: Without masses or tenderness.   Lt: Without masses or tenderness.  Anus and perineum: Normal    Assessment/Plan:  28 y.o. SBF G1, P0 for annual exam with complaint of vaginal discharge.  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  Flagyl 500 twice daily for 7 days, alcohol precautions reviewed.  Doxycycline 100 mg p.o. twice daily for 7 days, instructed to call if continued problems with hidradenitis, follow-up with dermatologist as needed.  SBE's, increasing regular cardio type exercise, decreasing calorie/carbs, multivitamin daily encouraged.  CBC, Pap.    Harrington Challenger [facility], 2:50 PM 09/14/2018

## 2018-09-14 NOTE — Patient Instructions (Addendum)
Deodorant not an antiperspirant  Health Maintenance, Female Adopting a healthy lifestyle and getting preventive care can go a long way to promote health and wellness. Talk with your health care provider about what schedule of regular examinations is right for you. This is a good chance for you to check in with your provider about disease prevention and staying healthy. In between checkups, there are plenty of things you can do on your own. Experts have done a lot of research about which lifestyle changes and preventive measures are most likely to keep you healthy. Ask your health care provider for more information. Weight and diet Eat a healthy diet  Be sure to include plenty of vegetables, fruits, low-fat dairy products, and lean protein.  Do not eat a lot of foods high in solid fats, added sugars, or salt.  Get regular exercise. This is one of the most important things you can do for your health. ? Most adults should exercise for at least 150 minutes each week. The exercise should increase your heart rate and make you sweat (moderate-intensity exercise). ? Most adults should also do strengthening exercises at least twice a week. This is in addition to the moderate-intensity exercise. Maintain a healthy weight  Body mass index (BMI) is a measurement that can be used to identify possible weight problems. It estimates body fat based on height and weight. Your health care provider can help determine your BMI and help you achieve or maintain a healthy weight.  For females 56 years of age and older: ? A BMI below 18.5 is considered underweight. ? A BMI of 18.5 to 24.9 is normal. ? A BMI of 25 to 29.9 is considered overweight. ? A BMI of 30 and above is considered obese. Watch levels of cholesterol and blood lipids  You should start having your blood tested for lipids and cholesterol at 28 years of age, then have this test every 5 years.  You may need to have your cholesterol levels checked  more often if: ? Your lipid or cholesterol levels are high. ? You are older than 28 years of age. ? You are at high risk for heart disease. Cancer screening Lung Cancer  Lung cancer screening is recommended for adults 70-70 years old who are at high risk for lung cancer because of a history of smoking.  A yearly low-dose CT scan of the lungs is recommended for people who: ? Currently smoke. ? Have quit within the past 15 years. ? Have at least a 30-pack-year history of smoking. A pack year is smoking an average of one pack of cigarettes a day for 1 year.  Yearly screening should continue until it has been 15 years since you quit.  Yearly screening should stop if you develop a health problem that would prevent you from having lung cancer treatment. Breast Cancer  Practice breast self-awareness. This means understanding how your breasts normally appear and feel.  It also means doing regular breast self-exams. Let your health care provider know about any changes, no matter how small.  If you are in your 20s or 30s, you should have a clinical breast exam (CBE) by a health care provider every 1-3 years as part of a regular health exam.  If you are 7 or older, have a CBE every year. Also consider having a breast X-ray (mammogram) every year.  If you have a family history of breast cancer, talk to your health care provider about genetic screening.  If you are at high  risk for breast cancer, talk to your health care provider about having an MRI and a mammogram every year.  Breast cancer gene (BRCA) assessment is recommended for women who have family members with BRCA-related cancers. BRCA-related cancers include: ? Breast. ? Ovarian. ? Tubal. ? Peritoneal cancers.  Results of the assessment will determine the need for genetic counseling and BRCA1 and BRCA2 testing. Cervical Cancer Your health care provider may recommend that you be screened regularly for cancer of the pelvic organs  (ovaries, uterus, and vagina). This screening involves a pelvic examination, including checking for microscopic changes to the surface of your cervix (Pap test). You may be encouraged to have this screening done every 3 years, beginning at age 55.  For women ages 12-65, health care providers may recommend pelvic exams and Pap testing every 3 years, or they may recommend the Pap and pelvic exam, combined with testing for human papilloma virus (HPV), every 5 years. Some types of HPV increase your risk of cervical cancer. Testing for HPV may also be done on women of any age with unclear Pap test results.  Other health care providers may not recommend any screening for nonpregnant women who are considered low risk for pelvic cancer and who do not have symptoms. Ask your health care provider if a screening pelvic exam is right for you.  If you have had past treatment for cervical cancer or a condition that could lead to cancer, you need Pap tests and screening for cancer for at least 20 years after your treatment. If Pap tests have been discontinued, your risk factors (such as having a new sexual partner) need to be reassessed to determine if screening should resume. Some women have medical problems that increase the chance of getting cervical cancer. In these cases, your health care provider may recommend more frequent screening and Pap tests. Colorectal Cancer  This type of cancer can be detected and often prevented.  Routine colorectal cancer screening usually begins at 28 years of age and continues through 28 years of age.  Your health care provider may recommend screening at an earlier age if you have risk factors for colon cancer.  Your health care provider may also recommend using home test kits to check for hidden blood in the stool.  A small camera at the end of a tube can be used to examine your colon directly (sigmoidoscopy or colonoscopy). This is done to check for the earliest forms of  colorectal cancer.  Routine screening usually begins at age 91.  Direct examination of the colon should be repeated every 5-10 years through 28 years of age. However, you may need to be screened more often if early forms of precancerous polyps or small growths are found. Skin Cancer  Check your skin from head to toe regularly.  Tell your health care provider about any new moles or changes in moles, especially if there is a change in a mole's shape or color.  Also tell your health care provider if you have a mole that is larger than the size of a pencil eraser.  Always use sunscreen. Apply sunscreen liberally and repeatedly throughout the day.  Protect yourself by wearing long sleeves, pants, a wide-brimmed hat, and sunglasses whenever you are outside. Heart disease, diabetes, and high blood pressure  High blood pressure causes heart disease and increases the risk of stroke. High blood pressure is more likely to develop in: ? People who have blood pressure in the high end of the normal range (  130-139/85-89 mm Hg). ? People who are overweight or obese. ? People who are African American.  If you are 71-22 years of age, have your blood pressure checked every 3-5 years. If you are 31 years of age or older, have your blood pressure checked every year. You should have your blood pressure measured twice-once when you are at a hospital or clinic, and once when you are not at a hospital or clinic. Record the average of the two measurements. To check your blood pressure when you are not at a hospital or clinic, you can use: ? An automated blood pressure machine at a pharmacy. ? A home blood pressure monitor.  If you are between 44 years and 23 years old, ask your health care provider if you should take aspirin to prevent strokes.  Have regular diabetes screenings. This involves taking a blood sample to check your fasting blood sugar level. ? If you are at a normal weight and have a low risk for  diabetes, have this test once every three years after 28 years of age. ? If you are overweight and have a high risk for diabetes, consider being tested at a younger age or more often. Preventing infection Hepatitis B  If you have a higher risk for hepatitis B, you should be screened for this virus. You are considered at high risk for hepatitis B if: ? You were born in a country where hepatitis B is common. Ask your health care provider which countries are considered high risk. ? Your parents were born in a high-risk country, and you have not been immunized against hepatitis B (hepatitis B vaccine). ? You have HIV or AIDS. ? You use needles to inject street drugs. ? You live with someone who has hepatitis B. ? You have had sex with someone who has hepatitis B. ? You get hemodialysis treatment. ? You take certain medicines for conditions, including cancer, organ transplantation, and autoimmune conditions. Hepatitis C  Blood testing is recommended for: ? Everyone born from [birth year] through 1965. ? Anyone with known risk factors for hepatitis C. Sexually transmitted infections (STIs)  You should be screened for sexually transmitted infections (STIs) including gonorrhea and chlamydia if: ? You are sexually active and are younger than 28 years of age. ? You are older than 28 years of age and your health care provider tells you that you are at risk for this type of infection. ? Your sexual activity has changed since you were last screened and you are at an increased risk for chlamydia or gonorrhea. Ask your health care provider if you are at risk.  If you do not have HIV, but are at risk, it may be recommended that you take a prescription medicine daily to prevent HIV infection. This is called pre-exposure prophylaxis (PrEP). You are considered at risk if: ? You are sexually active and do not regularly use condoms or know the HIV status of your partner(s). ? You take drugs by injection. ? You are  sexually active with a partner who has HIV. Talk with your health care provider about whether you are at high risk of being infected with HIV. If you choose to begin PrEP, you should first be tested for HIV. You should then be tested every 3 months for as long as you are taking PrEP. Pregnancy  If you are premenopausal and you may become pregnant, ask your health care provider about preconception counseling.  If you may become pregnant, take 400 to 800 micrograms (  mcg) of folic acid every day.  If you want to prevent pregnancy, talk to your health care provider about birth control (contraception). Osteoporosis and menopause  Osteoporosis is a disease in which the bones lose minerals and strength with aging. This can result in serious bone fractures. Your risk for osteoporosis can be identified using a bone density scan.  If you are 34 years of age or older, or if you are at risk for osteoporosis and fractures, ask your health care provider if you should be screened.  Ask your health care provider whether you should take a calcium or vitamin D supplement to lower your risk for osteoporosis.  Menopause may have certain physical symptoms and risks.  Hormone replacement therapy may reduce some of these symptoms and risks. Talk to your health care provider about whether hormone replacement therapy is right for you. Follow these instructions at home:  Schedule regular health, dental, and eye exams.  Stay current with your immunizations.  Do not use any tobacco products including cigarettes, chewing tobacco, or electronic cigarettes.  If you are pregnant, do not drink alcohol.  If you are breastfeeding, limit how much and how often you drink alcohol.  Limit alcohol intake to no more than 1 drink per day for nonpregnant women. One drink equals 12 ounces of beer, 5 ounces of wine, or 1 ounces of hard liquor.  Do not use street drugs.  Do not share needles.  Ask your health care  provider for help if you need support or information about quitting drugs.  Tell your health care provider if you often feel depressed.  Tell your health care provider if you have ever been abused or do not feel safe at home. This information is not intended to replace advice given to you by your health care provider. Make sure you discuss any questions you have with your health care provider. Document Released: 12/16/2010 Document Revised: 11/08/2015 Document Reviewed: 03/06/2015 Elsevier Interactive Patient Education  2019 [place].  Carbohydrate Counting for Diabetes Mellitus, Adult  Carbohydrate counting is a method of keeping track of how many carbohydrates you eat. Eating carbohydrates naturally increases the amount of sugar (glucose) in the blood. Counting how many carbohydrates you eat helps keep your blood glucose within normal limits, which helps you manage your diabetes (diabetes mellitus). It is important to know how many carbohydrates you can safely have in each meal. This is different for every person. A diet and nutrition specialist (registered dietitian) can help you make a meal plan and calculate how many carbohydrates you should have at each meal and snack. Carbohydrates are found in the following foods:  Grains, such as breads and cereals.  Dried beans and soy products.  Starchy vegetables, such as potatoes, peas, and corn.  Fruit and fruit juices.  Milk and yogurt.  Sweets and snack foods, such as cake, cookies, candy, chips, and soft drinks. How do I count carbohydrates? There are two ways to count carbohydrates in food. You can use either of the methods or a combination of both. Reading "Nutrition Facts" on packaged food The "Nutrition Facts" list is included on the labels of almost all packaged foods and beverages in the [place] It includes:  The serving size.  Information about nutrients in each serving, including the grams (g) of carbohydrate per  serving. To use the "Nutrition Facts":  Decide how many servings you will have.  Multiply the number of servings by the number of carbohydrates per serving.  The resulting number  is the total amount of carbohydrates that you will be having. Learning standard serving sizes of other foods When you eat carbohydrate foods that are not packaged or do not include "Nutrition Facts" on the label, you need to measure the servings in order to count the amount of carbohydrates:  Measure the foods that you will eat with a food scale or measuring cup, if needed.  Decide how many standard-size servings you will eat.  Multiply the number of servings by 15. Most carbohydrate-rich foods have about 15 g of carbohydrates per serving. ? For example, if you eat 8 oz (170 g) of strawberries, you will have eaten 2 servings and 30 g of carbohydrates (2 servings x 15 g = 30 g).  For foods that have more than one food mixed, such as soups and casseroles, you must count the carbohydrates in each food that is included. The following list contains standard serving sizes of common carbohydrate-rich foods. Each of these servings has about 15 g of carbohydrates:   hamburger bun or  English muffin.   oz (15 mL) syrup.   oz (14 g) jelly.  1 slice of bread.  1 six-inch tortilla.  3 oz (85 g) cooked rice or pasta.  4 oz (113 g) cooked dried beans.  4 oz (113 g) starchy vegetable, such as peas, corn, or potatoes.  4 oz (113 g) hot cereal.  4 oz (113 g) mashed potatoes or  of a large baked potato.  4 oz (113 g) canned or frozen fruit.  4 oz (120 mL) fruit juice.  4-6 crackers.  6 chicken nuggets.  6 oz (170 g) unsweetened dry cereal.  6 oz (170 g) plain fat-free yogurt or yogurt sweetened with artificial sweeteners.  8 oz (240 mL) milk.  8 oz (170 g) fresh fruit or one small piece of fruit.  24 oz (680 g) popped popcorn. Example of carbohydrate counting Sample meal  3 oz (85 g) chicken  breast.  6 oz (170 g) brown rice.  4 oz (113 g) corn.  8 oz (240 mL) milk.  8 oz (170 g) strawberries with sugar-free whipped topping. Carbohydrate calculation 1. Identify the foods that contain carbohydrates: ? Rice. ? Corn. ? Milk. ? Strawberries. 2. Calculate how many servings you have of each food: ? 2 servings rice. ? 1 serving corn. ? 1 serving milk. ? 1 serving strawberries. 3. Multiply each number of servings by 15 g: ? 2 servings rice x 15 g = 30 g. ? 1 serving corn x 15 g = 15 g. ? 1 serving milk x 15 g = 15 g. ? 1 serving strawberries x 15 g = 15 g. 4. Add together all of the amounts to find the total grams of carbohydrates eaten: ? 30 g + 15 g + 15 g + 15 g = 75 g of carbohydrates total. Summary  Carbohydrate counting is a method of keeping track of how many carbohydrates you eat.  Eating carbohydrates naturally increases the amount of sugar (glucose) in the blood.  Counting how many carbohydrates you eat helps keep your blood glucose within normal limits, which helps you manage your diabetes.  A diet and nutrition specialist (registered dietitian) can help you make a meal plan and calculate how many carbohydrates you should have at each meal and snack. This information is not intended to replace advice given to you by your health care provider. Make sure you discuss any questions you have with your health care provider. Document  Released: 06/02/2005 Document Revised: 12/10/2016 Document Reviewed: 11/14/2015 Elsevier Interactive Patient Education  2019 [employer]  Bacterial Vaginosis  Bacterial vaginosis is an infection of the vagina. It happens when too many normal germs (healthy bacteria) grow in the vagina. This infection puts you at risk for infections from sex (STIs). Treating this infection can lower your risk for some STIs. You should also treat this if you are pregnant. It can cause your baby to be born early. Follow these instructions at  home: Medicines  Take over-the-counter and prescription medicines only as told by your doctor.  Take or use your antibiotic medicine as told by your doctor. Do not stop taking or using it even if you start to feel better. General instructions  If you your sexual partner is a woman, tell her that you have this infection. She needs to get treatment if she has symptoms. If you have a female partner, he does not need to be treated.  During treatment: ? Avoid sex. ? Do not douche. ? Avoid alcohol as told. ? Avoid breastfeeding as told.  Drink enough fluid to keep your pee (urine) clear or pale yellow.  Keep your vagina and butt (rectum) clean. ? Wash the area with warm water every day. ? Wipe from front to back after you use the toilet.  Keep all follow-up visits as told by your doctor. This is important. Preventing this condition  Do not douche.  Use only warm water to wash around your vagina.  Use protection when you have sex. This includes: ? Latex condoms. ? Dental dams.  Limit how many people you have sex with. It is best to only have sex with the same person (be monogamous).  Get tested for STIs. Have your partner get tested.  Wear underwear that is cotton or lined with cotton.  Avoid tight pants and pantyhose. This is most important in summer.  Do not use any products that have nicotine or tobacco in them. These include cigarettes and e-cigarettes. If you need help quitting, ask your doctor.  Do not use illegal drugs.  Limit how much alcohol you drink. Contact a doctor if:  Your symptoms do not get better, even after you are treated.  You have more discharge or pain when you pee (urinate).  You have a fever.  You have pain in your belly (abdomen).  You have pain with sex.  Your bleed from your vagina between periods. Summary  This infection happens when too many germs (bacteria) grow in the vagina.  Treating this condition can lower your risk for some  infections from sex (STIs).  You should also treat this if you are pregnant. It can cause early (premature) birth.  Do not stop taking or using your antibiotic medicine even if you start to feel better. This information is not intended to replace advice given to you by your health care provider. Make sure you discuss any questions you have with your health care provider. Document Released: 03/11/2008 Document Revised: 02/16/2016 Document Reviewed: 02/16/2016 Elsevier Interactive Patient Education  2019 [employer].

## 2018-09-15 LAB — PAP IG W/ RFLX HPV ASCU

## 2018-09-29 NOTE — Telephone Encounter (Signed)
My chart message came back unread, patient informed.  

## 2019-04-14 IMAGING — DX DG CHEST 2V
2 series · 2 of 2 positions shown · non-contrast
Comparison: None.

CLINICAL DATA: Cough and fever

EXAM:
CHEST - 2 VIEW

[chest pa]
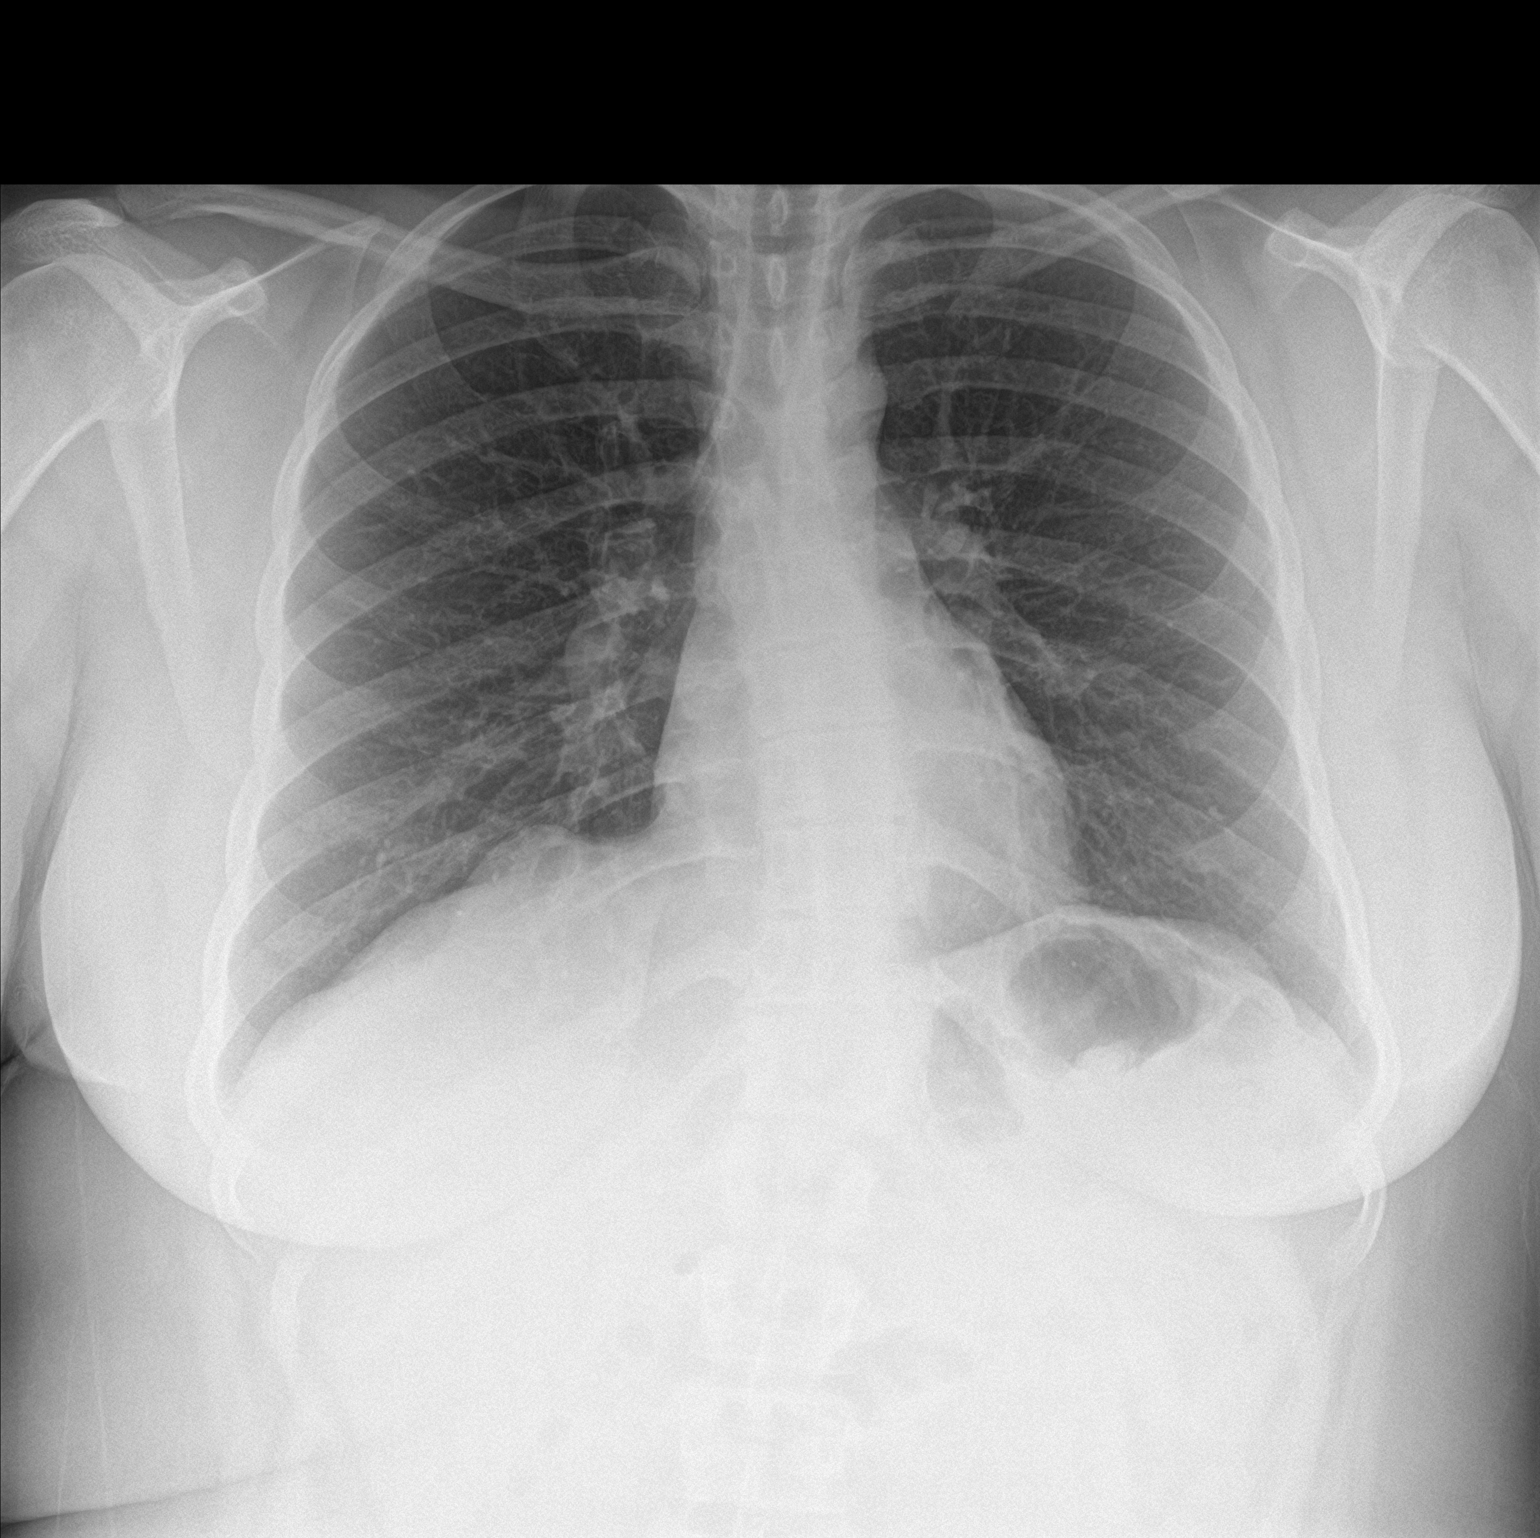

[chest lat]
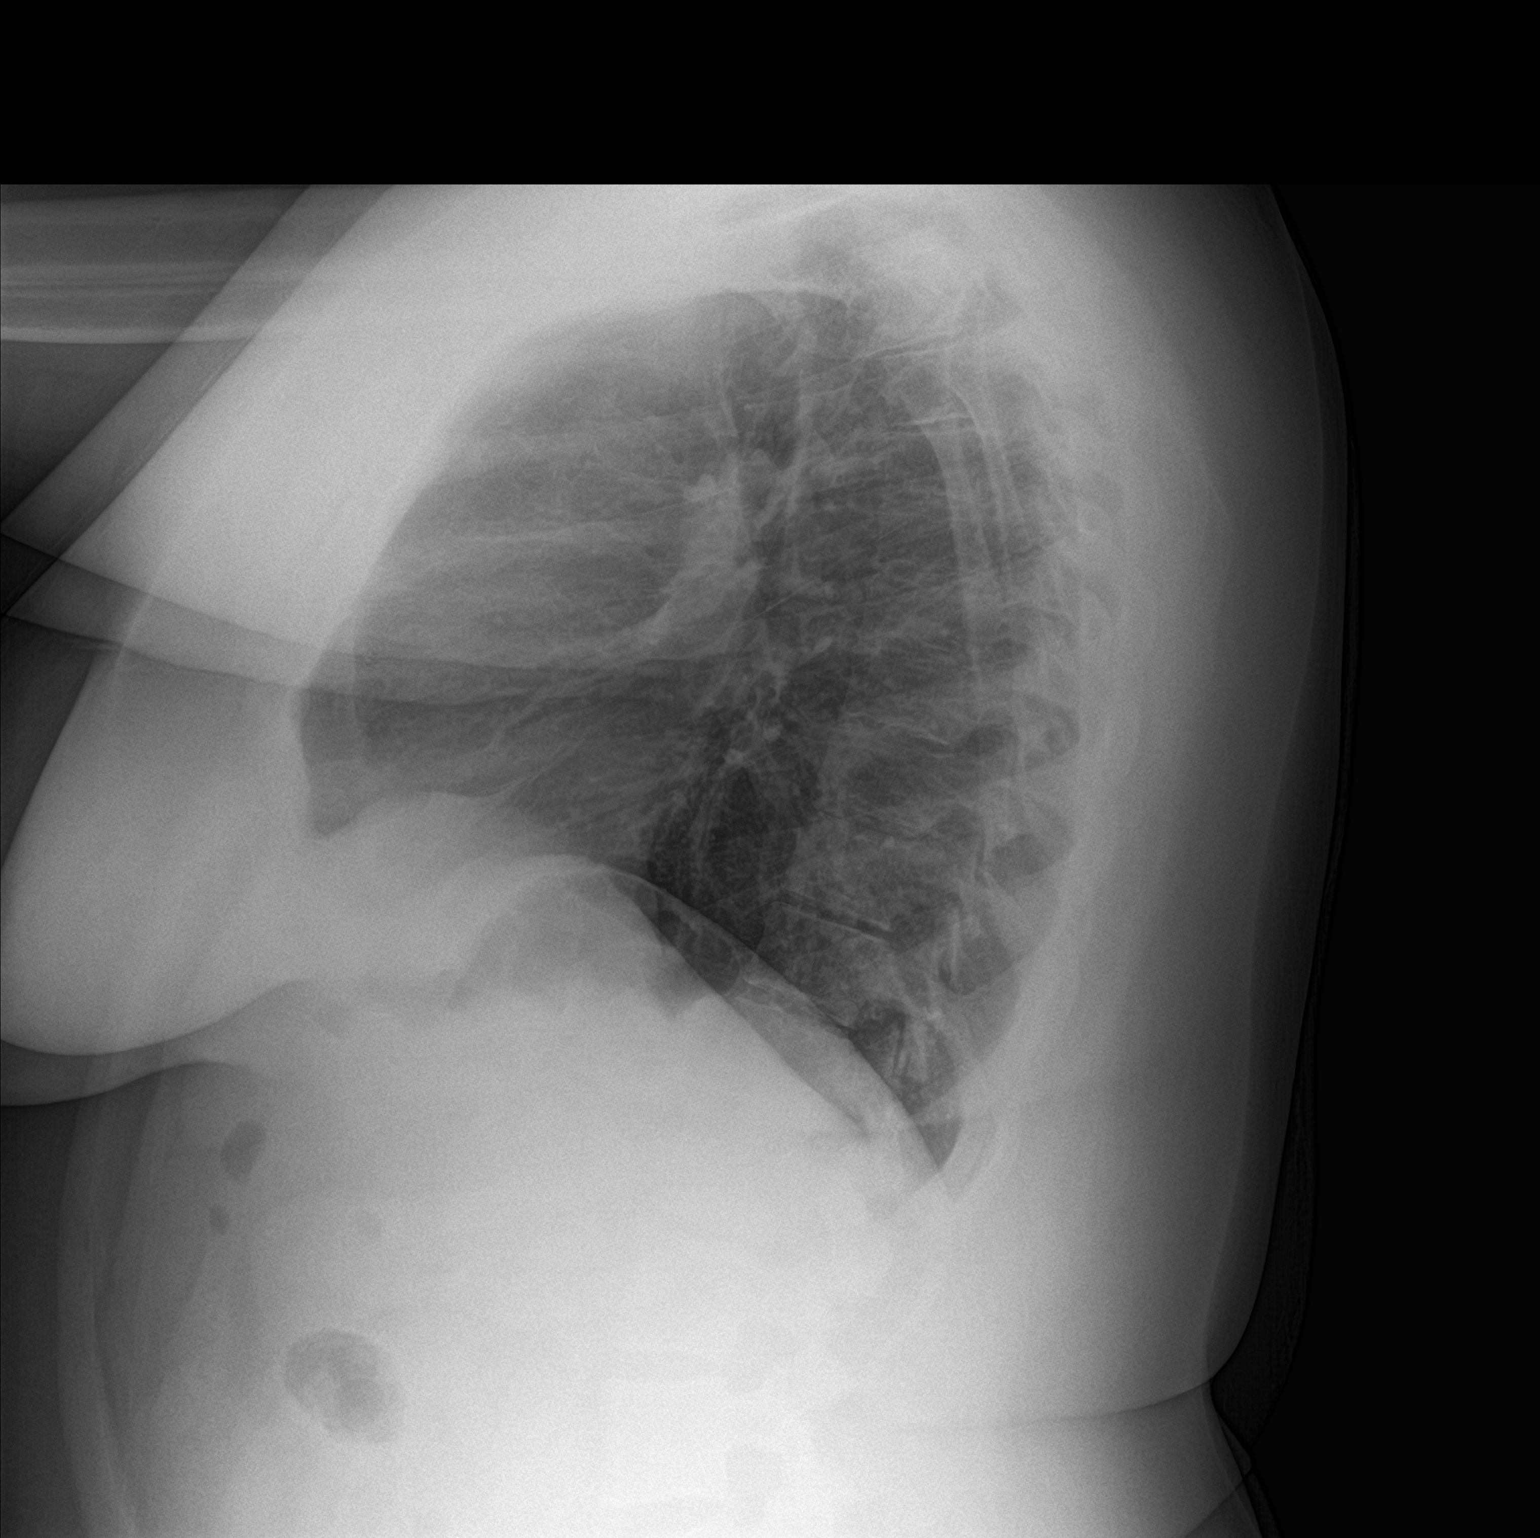

[2 of 2 positions shown; findings below may reference images not displayed]

FINDINGS: The heart size and mediastinal contours are within normal limits.
Both lungs are clear. The visualized skeletal structures are
unremarkable.
IMPRESSION: No active cardiopulmonary disease.

## 2019-05-02 ENCOUNTER — Other Ambulatory Visit: Payer: Self-pay

## 2019-05-02 ENCOUNTER — Encounter (HOSPITAL_BASED_OUTPATIENT_CLINIC_OR_DEPARTMENT_OTHER): Payer: Self-pay | Admitting: *Deleted

## 2019-05-02 ENCOUNTER — Emergency Department (HOSPITAL_BASED_OUTPATIENT_CLINIC_OR_DEPARTMENT_OTHER)
Admission: EM | Admit: 2019-05-02 | Discharge: 2019-05-02 | Disposition: A | Discharge status: Home / Self Care | Payer: PRIVATE HEALTH INSURANCE | Attending: Emergency Medicine | Admitting: Emergency Medicine

## 2019-05-02 DIAGNOSIS — L02412 Cutaneous abscess of left axilla: Secondary | ICD-10-CM

## 2019-05-02 DIAGNOSIS — R2232 Localized swelling, mass and lump, left upper limb: Secondary | ICD-10-CM | POA: Diagnosis present

## 2019-05-02 MED ORDER — SULFAMETHOXAZOLE-TRIMETHOPRIM 800-160 MG PO TABS: 1.0000 | ORAL_TABLET | Freq: Two times a day (BID) | ORAL | 0 refills | Status: AC

## 2019-05-02 MED ORDER — LIDOCAINE HCL 2 % IJ SOLN
20.0000 mL | Freq: Once | INTRAMUSCULAR | Status: AC
  Administered 2019-05-02: 400 mg
  Filled 2019-05-02: qty 20

## 2019-05-02 NOTE — ED Triage Notes (Signed)
Abscess to her left axilla.

## 2019-05-02 NOTE — Discharge Instructions (Addendum)
You were evaluated in the Emergency Department and after careful evaluation, we did not find any emergent condition requiring admission or further testing in the hospital.  We drained your abscesses here in the emergency department.  We suspect that your underlying condition is called hidradenitis.  We encourage you to follow-up with your dermatologist.  Please take the antibiotic as directed.  Use Tylenol or Motrin at home for discomfort.  Please return to the Emergency Department if you experience any worsening of your condition.  We encourage you to follow up with a primary care provider.  Thank you for allowing [place] to be a part of your care.

## 2019-05-02 NOTE — ED Provider Notes (Signed)
[facility] Provider Note MRN:  [id number]  Arrival date & time: 05/02/19     Chief Complaint   Abscess   History of Present Illness   Crystal Barton is a 28 y.o. year-old female with no pertinent past medical history presenting to the ED with chief complaint of abscess.  1 or 2 days of increasing pain and swelling to the left axilla.  She experiences boils to her bilateral axilla quite often.  Usually they drain on their own but this 1 is very tender and is not draining.  Denies fever, no other complaints.  Review of Systems  A problem-focused ROS was performed. Positive for abscess.  Patient denies fever.  Patient's Health History    Past Medical History:  Diagnosis Date  . STD (sexually transmitted disease)    Chlamydia    History reviewed. No pertinent surgical history.  No family history on file.  Social History   Socioeconomic History  . Marital status: Single    Spouse name: Not on file  . Number of children: Not on file  . Years of education: Not on file  . Highest education level: Not on file  Occupational History  . Not on file  Social Needs  . Financial resource strain: Not on file  . Food insecurity    Worry: Not on file    Inability: Not on file  . Transportation needs    Medical: Not on file    Non-medical: Not on file  Tobacco Use  . Smoking status: Current Every Day Smoker    Types: Cigarettes  . Smokeless tobacco: Never Used  Substance and Sexual Activity  . Alcohol use: Yes    Alcohol/week: 0.0 standard drinks    Comment: Occas  . Drug use: No  . Sexual activity: Yes    Birth control/protection: Condom    Comment: 1st intercourse 28 yo-Fewer than 5 partners  Lifestyle  . Physical activity    Days per week: Not on file    Minutes per session: Not on file  . Stress: Not on file  Relationships  . Social Musicianconnections    Talks on phone: Not on file    Gets together: Not on file    Attends  religious service: Not on file    Active member of club or organization: Not on file    Attends meetings of clubs or organizations: Not on file    Relationship status: Not on file  . Intimate partner violence    Fear of current or ex partner: Not on file    Emotionally abused: Not on file    Physically abused: Not on file    Forced sexual activity: Not on file  Other Topics Concern  . Not on file  Social History Narrative  . Not on file     Physical Exam  Vital Signs and Nursing Notes reviewed Vitals:   05/02/19 1919  BP: (!) 144/102  Pulse: 98  Resp: 14  Temp: 99.6 F (37.6 C)  SpO2: 98%    CONSTITUTIONAL: Well-appearing, NAD NEURO:  Alert and oriented x 3, no focal deficits EYES:  eyes equal and reactive ENT/NECK:  no LAD, no JVD CARDIO: Regular rate, well-perfused, normal S1 and S2 PULM:  CTAB no wheezing or rhonchi GI/GU:  normal bowel sounds, non-distended, non-tender MSK/SPINE:  No gross deformities, no edema SKIN: 3 cm fluctuant area to the left axilla with surrounding erythema and induration.  Adjacent to this there is  a 1 cm area of fluctuance. PSYCH:  Appropriate speech and behavior  Diagnostic and Interventional Summary    EKG Interpretation  Date/Time:    Ventricular Rate:    PR Interval:    QRS Duration:   QT Interval:    QTC Calculation:   R Axis:     Text Interpretation:        Labs Reviewed - No data to display  No orders to display    Medications  lidocaine (XYLOCAINE) 2 % (with pres) injection 400 mg (400 mg Other Given 05/02/19 2112)     Procedures  /  Critical Care .Marland KitchenIncision and Drainage  Date/Time: 05/02/2019 10:15 PM Performed by: Sabas Sous, MD Authorized by: Sabas Sous, MD   Consent:    Consent obtained:  Verbal   Consent given by:  Patient   Risks discussed:  Bleeding, damage to other organs, incomplete drainage and pain Location:    Type:  Abscess   Size:  4cm   Location: Left axilla. Pre-procedure details:     Skin preparation:  Betadine Anesthesia (see MAR for exact dosages):    Anesthesia method:  Topical application and local infiltration   Local anesthetic:  Lidocaine 2% w/o epi Procedure type:    Complexity:  Simple Procedure details:    Incision types:  Cruciate   Incision depth:  Submucosal   Scalpel blade:  11   Wound management:  Probed and deloculated and irrigated with saline   Drainage:  Bloody and purulent   Drainage amount:  Moderate   Wound treatment:  Wound left open   Packing materials:  None Post-procedure details:    Patient tolerance of procedure:  Tolerated well, no immediate complications .Marland KitchenIncision and Drainage  Date/Time: 05/02/2019 10:16 PM Performed by: Sabas Sous, MD Authorized by: Sabas Sous, MD   Consent:    Consent obtained:  Verbal   Risks discussed:  Bleeding, damage to other organs, incomplete drainage and pain Location:    Type:  Abscess   Size:  1cm   Location: Left axilla. Pre-procedure details:    Skin preparation:  Betadine Anesthesia (see MAR for exact dosages):    Anesthesia method:  Local infiltration   Local anesthetic:  Lidocaine 2% w/o epi Procedure type:    Complexity:  Simple Procedure details:    Incision types:  Cruciate   Incision depth:  Submucosal   Scalpel blade:  11   Wound management:  Probed and deloculated and irrigated with saline   Drainage:  Bloody and purulent   Drainage amount:  Moderate   Wound treatment:  Wound left open   Packing materials:  None Post-procedure details:    Patient tolerance of procedure:  Tolerated well, no immediate complications    ED Course and Medical Decision Making  I have reviewed the triage vital signs and the nursing notes.  Pertinent labs & imaging results that were available during my care of the patient were reviewed by me and considered in my medical decision making (see below for details).     1 large and 1 small abscess to the left adnexa.  Suspect  underlying hidradenitis.  Will drain for comfort and provide antibiotics given the induration.   Drained as described above, appropriate for discharge.  Patient advised to follow-up with her dermatologist.  Elmer Sow. Pilar Plate, MD [facility] Emergency Medicine [facility] [email or web address]  Final Clinical Impressions(s) / ED Diagnoses     ICD-10-CM   1. Abscess of left  axilla  L02.412     ED Discharge Orders         Ordered    sulfamethoxazole-trimethoprim (BACTRIM DS) 800-160 MG tablet  2 times daily     05/02/19 2214           Discharge Instructions Discussed with and Provided to Patient:     Discharge Instructions     You were evaluated in the Emergency Department and after careful evaluation, we did not find any emergent condition requiring admission or further testing in the hospital.  We drained your abscesses here in the emergency department.  We suspect that your underlying condition is called hidradenitis.  We encourage you to follow-up with your dermatologist.  Please take the antibiotic as directed.  Use Tylenol or Motrin at home for discomfort.  Please return to the Emergency Department if you experience any worsening of your condition.  We encourage you to follow up with a primary care provider.  Thank you for allowing [place] to be a part of your care.        Maudie Flakes, MD 05/02/19 2217

## 2019-05-24 ENCOUNTER — Other Ambulatory Visit: Payer: Self-pay

## 2019-05-25 ENCOUNTER — Ambulatory Visit: Payer: PRIVATE HEALTH INSURANCE | Admitting: Women's Health

## 2019-05-25 ENCOUNTER — Encounter: Payer: Self-pay | Admitting: Women's Health

## 2019-05-25 VITALS — BP 122/80

## 2019-05-25 DIAGNOSIS — Z113 Encounter for screening for infections with a predominantly sexual mode of transmission: Secondary | ICD-10-CM | POA: Diagnosis not present

## 2019-05-25 LAB — WET PREP FOR TRICH, YEAST, CLUE

## 2019-05-25 NOTE — Progress Notes (Signed)
28 year old SBF G0 presents requesting STD screen.  Recently heard ex partner gay.  Denies vaginal discharge, UTI symptoms, abdominal/back pain or fever.   Monthly cycle/condoms.  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  Review of systems, no known medical problems.  05/02/2019 left axilla abscess I&D now resolved.  Exam: Appears well, obese, no CVAT.  Abdomen obese, nontender, external genitalia within normal limits, speculum exam scant menses, GC/chlamydia culture taken, wet prep negative, bimanual no CMT or adnexal tenderness.  STD screen  Plan: HIV, RPR.  Contraception options reviewed and declined, states will use condoms when sexually active.

## 2019-05-26 LAB — RPR: RPR Ser Ql: NONREACTIVE

## 2019-05-26 LAB — CHLAMYDIA PROBE AMP THINPREP: C. trachomatis RNA, TMA: NOT DETECTED

## 2019-05-26 LAB — GC PROBE AMP THINPREP: N. gonorrhoeae RNA, TMA: NOT DETECTED

## 2019-05-26 LAB — HIV ANTIBODY (ROUTINE TESTING W REFLEX): HIV 1&2 Ab, 4th Generation: NONREACTIVE

## 2019-07-10 ENCOUNTER — Other Ambulatory Visit: Payer: Self-pay

## 2019-07-10 ENCOUNTER — Emergency Department (HOSPITAL_BASED_OUTPATIENT_CLINIC_OR_DEPARTMENT_OTHER)
Admission: EM | Admit: 2019-07-10 | Discharge: 2019-07-10 | Disposition: A | Discharge status: Home / Self Care | Payer: Self-pay | Attending: Emergency Medicine | Admitting: Emergency Medicine

## 2019-07-10 ENCOUNTER — Encounter (HOSPITAL_BASED_OUTPATIENT_CLINIC_OR_DEPARTMENT_OTHER): Payer: Self-pay | Admitting: Emergency Medicine

## 2019-07-10 ENCOUNTER — Emergency Department (HOSPITAL_BASED_OUTPATIENT_CLINIC_OR_DEPARTMENT_OTHER): Payer: Self-pay

## 2019-07-10 DIAGNOSIS — I10 Essential (primary) hypertension: Secondary | ICD-10-CM | POA: Insufficient documentation

## 2019-07-10 DIAGNOSIS — W010XXA Fall on same level from slipping, tripping and stumbling without subsequent striking against object, initial encounter: Secondary | ICD-10-CM | POA: Insufficient documentation

## 2019-07-10 DIAGNOSIS — W19XXXA Unspecified fall, initial encounter: Secondary | ICD-10-CM

## 2019-07-10 DIAGNOSIS — Y999 Unspecified external cause status: Secondary | ICD-10-CM | POA: Insufficient documentation

## 2019-07-10 DIAGNOSIS — Y939 Activity, unspecified: Secondary | ICD-10-CM | POA: Insufficient documentation

## 2019-07-10 DIAGNOSIS — S4991XA Unspecified injury of right shoulder and upper arm, initial encounter: Secondary | ICD-10-CM | POA: Insufficient documentation

## 2019-07-10 DIAGNOSIS — Y929 Unspecified place or not applicable: Secondary | ICD-10-CM | POA: Insufficient documentation

## 2019-07-10 DIAGNOSIS — F17210 Nicotine dependence, cigarettes, uncomplicated: Secondary | ICD-10-CM | POA: Insufficient documentation

## 2019-07-10 HISTORY — DX: Essential (primary) hypertension: I10

## 2019-07-10 MED ORDER — ACETAMINOPHEN 500 MG PO TABS
1000.0000 mg | ORAL_TABLET | Freq: Once | ORAL | Status: AC
  Administered 2019-07-10: 1000 mg via ORAL
  Filled 2019-07-10: qty 2

## 2019-07-10 MED ORDER — IBUPROFEN 800 MG PO TABS
800.0000 mg | ORAL_TABLET | Freq: Once | ORAL | Status: AC
  Administered 2019-07-10: 800 mg via ORAL
  Filled 2019-07-10: qty 1

## 2019-07-10 MED ORDER — METHOCARBAMOL 500 MG PO TABS: 500.0000 mg | ORAL_TABLET | Freq: Two times a day (BID) | ORAL | 0 refills | Status: DC

## 2019-07-10 MED ORDER — IBUPROFEN 600 MG PO TABS: 600.0000 mg | ORAL_TABLET | Freq: Four times a day (QID) | ORAL | 0 refills | Status: DC | PRN

## 2019-07-10 NOTE — ED Notes (Signed)
ED Provider at bedside. 

## 2019-07-10 NOTE — ED Provider Notes (Signed)
[facility] EMERGENCY DEPARTMENT Provider Note   CSN: [id number] Arrival date & time: 07/10/19  1722     History Chief Complaint  Patient presents with  . Shoulder Pain  . Fall    Crystal Barton is a 29 y.o. female.  HPI      Crystal Barton is a 29 y.o. female, patient with no pertinent past medical history, presenting to the ED with injury to the right shoulder that occurred last night.  Patient states she slipped and sustained a standing level fall, landing laterally on her right shoulder. She noted some initial soreness, but pain was much worse upon waking this morning.  Pain is throbbing, located in the right shoulder, radiating towards the deltoid, 9/10. She has not tried any therapies for her injury. Denies head injury, LOC, nausea/vomiting, neck/back pain, numbness, weakness, shortness of breath, other injuries, or any other complaints.     Past Medical History:  Diagnosis Date  . Hypertension   . STD (sexually transmitted disease)    Chlamydia    Patient Active Problem List   Diagnosis Date Noted  . 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 Dysmenorrhea 01/02/2016  . Morbid obesity (HCC) 11/02/2013    History reviewed. No pertinent surgical history.   OB History    Gravida  1   Para      Term      Preterm      AB  1   Living  0     SAB  1   TAB      Ectopic      Multiple      Live Births              History reviewed. No pertinent family history.  Social History   Tobacco Use  . Smoking status: Current Every Day Smoker    Types: Cigarettes  . Smokeless tobacco: Never Used  Substance Use Topics  . Alcohol use: Yes    Alcohol/week: 0.0 standard drinks    Comment: Occas  . Drug use: No    Home Medications Prior to Admission medications   Medication Sig Start Date End Date Taking? Authorizing Provider  ibuprofen (ADVIL) 600 MG tablet Take 1 tablet (600 mg total) by mouth every 6 (six) hours as needed. 07/10/19   Faithe Ariola,  Tylor Gambrill C, PA-C  methocarbamol (ROBAXIN) 500 MG tablet Take 1 tablet (500 mg total) by mouth 2 (two) times daily. 07/10/19   Genell Thede, Hillard Danker, PA-C    Allergies    Patient has no known allergies.  Review of Systems   Review of Systems  Constitutional: Negative for diaphoresis.  Respiratory: Negative for shortness of breath.   Cardiovascular: Negative for chest pain.  Gastrointestinal: Negative for abdominal pain, nausea and vomiting.  Musculoskeletal: Positive for arthralgias. Negative for back pain and neck pain.  Neurological: Negative for dizziness, weakness and numbness.  All other systems reviewed and are negative.   Physical Exam Updated Vital Signs BP (!) 128/97   Pulse (!) 115   Temp 98.8 F (37.1 C) (Oral)   Resp 16   Ht 5\' 8"  (1.727 m)   Wt 120.2 kg   SpO2 100%   BMI 40.29 kg/m   Physical Exam Vitals and nursing note reviewed.  Constitutional:      General: She is not in acute distress.    Appearance: She is well-developed. She is not diaphoretic.  HENT:     Head: Normocephalic and atraumatic.     Comments:  No tenderness, swelling, deformity, or instability to the face or scalp.    Mouth/Throat:     Mouth: Mucous membranes are moist.     Pharynx: Oropharynx is clear.  Eyes:     Conjunctiva/sclera: Conjunctivae normal.  Cardiovascular:     Rate and Rhythm: Normal rate and regular rhythm.     Pulses: Normal pulses.          Radial pulses are 2+ on the right side and 2+ on the left side.     Comments: Tactile temperature in the extremities appropriate and equal bilaterally. Pulmonary:     Effort: Pulmonary effort is normal. No respiratory distress.     Breath sounds: Normal breath sounds.  Abdominal:     Palpations: Abdomen is soft.     Tenderness: There is no abdominal tenderness. There is no guarding.  Musculoskeletal:     Cervical back: Neck supple.     Comments: Tenderness to the right anterior and superior shoulder.  Also some tenderness noted to the  right deltoid region.  No noted swelling, color change, deformity, or instability.  No tenderness, deformity, or instability along the clavicle, scapula, or along the ribs.  Patient voluntarily abducts her right shoulder, but limits it to about 30 degrees from her body.  After x-ray resulted, more thorough exam was performed of the patient's right shoulder.  I was able to perform passive range of motion fully through each of the cardinal directions of the right shoulder.  Some muscle spasms were initially noted, through slow range of motion these were able to be relieved.  She is able to touch her left shoulder with her right hand.  No tenderness, swelling, deformity, or other signs of injury in the rest of the right upper extremity.   Full range of motion without pain or noted difficulty in the right elbow or wrist.  Normal motor function intact in all other extremities. No midline spinal tenderness.   Skin:    General: Skin is warm and dry.     Capillary Refill: Capillary refill takes less than 2 seconds.  Neurological:     Mental Status: She is alert.     Comments: Sensation grossly intact to light touch through each of the nerve distributions of the bilateral upper extremities. Abduction and adduction of the fingers intact against resistance. Grip strength equal bilaterally. Supination and pronation intact against resistance. Strength 5/5 through the cardinal directions of the bilateral wrists. Strength 5/5 with flexion and extension of the bilateral elbows. Patient can touch the thumb to each one of the fingertips without difficulty.  Patient can hold the "OK" sign against resistance.  Psychiatric:        Mood and Affect: Mood and affect normal.        Speech: Speech normal.        Behavior: Behavior normal.     ED Results / Procedures / Treatments   Labs (all labs ordered are listed, but only abnormal results are displayed) Labs Reviewed - No data to  display  EKG None  Radiology DG Shoulder Right  Result Date: 07/10/2019 CLINICAL DATA:  Pain EXAM: RIGHT SHOULDER - 2+ VIEW COMPARISON:  None. FINDINGS: There is no evidence of fracture or dislocation. There is no evidence of arthropathy or other focal bone abnormality. Soft tissues are unremarkable. IMPRESSION: No acute displaced fracture or dislocation. 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 Electronically Signed   By: Constance Holster M.D.   On: 07/10/2019 18:11  Procedures Procedures (including critical care time)  Medications Ordered in ED Medications  ibuprofen (ADVIL) tablet 800 mg (800 mg Oral Given 07/10/19 1746)  acetaminophen (TYLENOL) tablet 1,000 mg (1,000 mg Oral Given 07/10/19 1745)    ED Course  I have reviewed the triage vital signs and the nursing notes.  Pertinent labs & imaging results that were available during my care of the patient were reviewed by me and considered in my medical decision making (see chart for details).    MDM Rules/Calculators/A&P                      Patient presents with injury to the right shoulder.  No evidence of neurovascular compromise.  No acute abnormality on x-ray.  Range of motion demonstrated after x-ray. Patient placed in sling and given referral to orthopedic surgeon. The patient was given instructions for home care as well as return precautions. Patient voices understanding of these instructions, accepts the plan, and is comfortable with discharge.   Final Clinical Impression(s) / ED Diagnoses Final diagnoses:  Fall, initial encounter  Injury of right shoulder, initial encounter    Rx / DC Orders ED Discharge Orders         Ordered    methocarbamol (ROBAXIN) 500 MG tablet  2 times daily     07/10/19 1841    ibuprofen (ADVIL) 600 MG tablet  Every 6 hours PRN     07/10/19 1841           Anselm Pancoast, PA-C 07/10/19 2153    Rolan Bucco, MD 07/10/19 2205

## 2019-07-10 NOTE — ED Notes (Signed)
Patient transported to X-ray 

## 2019-07-10 NOTE — ED Triage Notes (Signed)
Pt is here after fall to right shoulder with pain and limited movement. No obvious deformity at this time.

## 2019-07-10 NOTE — Discharge Instructions (Addendum)
You have been seen today for a shoulder injury. There were no acute abnormalities on the x-rays, including no sign of fracture or dislocation, however, there could be injuries to the soft tissues, such as the ligaments or tendons that are not seen on xrays. There could also be what are called occult fractures that are small fractures not seen on xray. Antiinflammatory medications: Take 600 mg of ibuprofen every 6 hours or 440 mg (over the counter dose) to 500 mg (prescription dose) of naproxen every 12 hours for the next 3 days. After this time, these medications may be used as needed for pain. Take these medications with food to avoid upset stomach. Choose only one of these medications, do not take them together. Acetaminophen (generic for Tylenol): Should you continue to have additional pain while taking the ibuprofen or naproxen, you may add in acetaminophen as needed. Your daily total maximum amount of acetaminophen from all sources should be limited to 4000mg /day for persons without liver problems, or 2000mg /day for those with liver problems. Methocarbamol: Methocarbamol (generic for Robaxin) is a muscle relaxer and can help relieve stiff muscles or muscle spasms.  Do not drive or perform other dangerous activities while taking this medication as it can cause drowsiness as well as changes in reaction time and judgement. Ice: May apply ice to the area over the next 24 hours for 15 minutes at a time to reduce swelling. Heat: May apply heat to tight or spasming muscles. Elevation: Keep the extremity elevated as often as possible to reduce pain and inflammation. Support: Wear the sling for support and comfort. Wear this until pain resolves.  Be sure to take the arm out of the sling throughout the day and move the shoulder around to avoid stiffness. Exercises: Start by performing these exercises a few times a week, increasing the frequency until you are performing them twice daily.  Follow up: If symptoms  are improving, you may follow up with your primary care provider for any continued management. If symptoms are not starting to improve within a week, you should follow up with the orthopedic specialist within about two weeks. Return: Return to the ED for numbness, weakness, increasing pain, overall worsening symptoms, loss of function, or if symptoms are not improving, you have tried to follow up with the orthopedic specialist, and have been unable to do so.  For prescription assistance, may try using prescription discount sites or apps, such as goodrx.com

## 2019-07-18 HISTORY — PX: SHOULDER SURGERY: SHX246

## 2019-08-16 IMAGING — CR CHEST - 2 VIEW
2 series · 2 of 2 positions shown · non-contrast
Comparison: 04/21/2018

CLINICAL DATA: Pt having cough,fever and congestion for a
day,smoker

EXAM:
CHEST - 2 VIEW

[w chest pa]
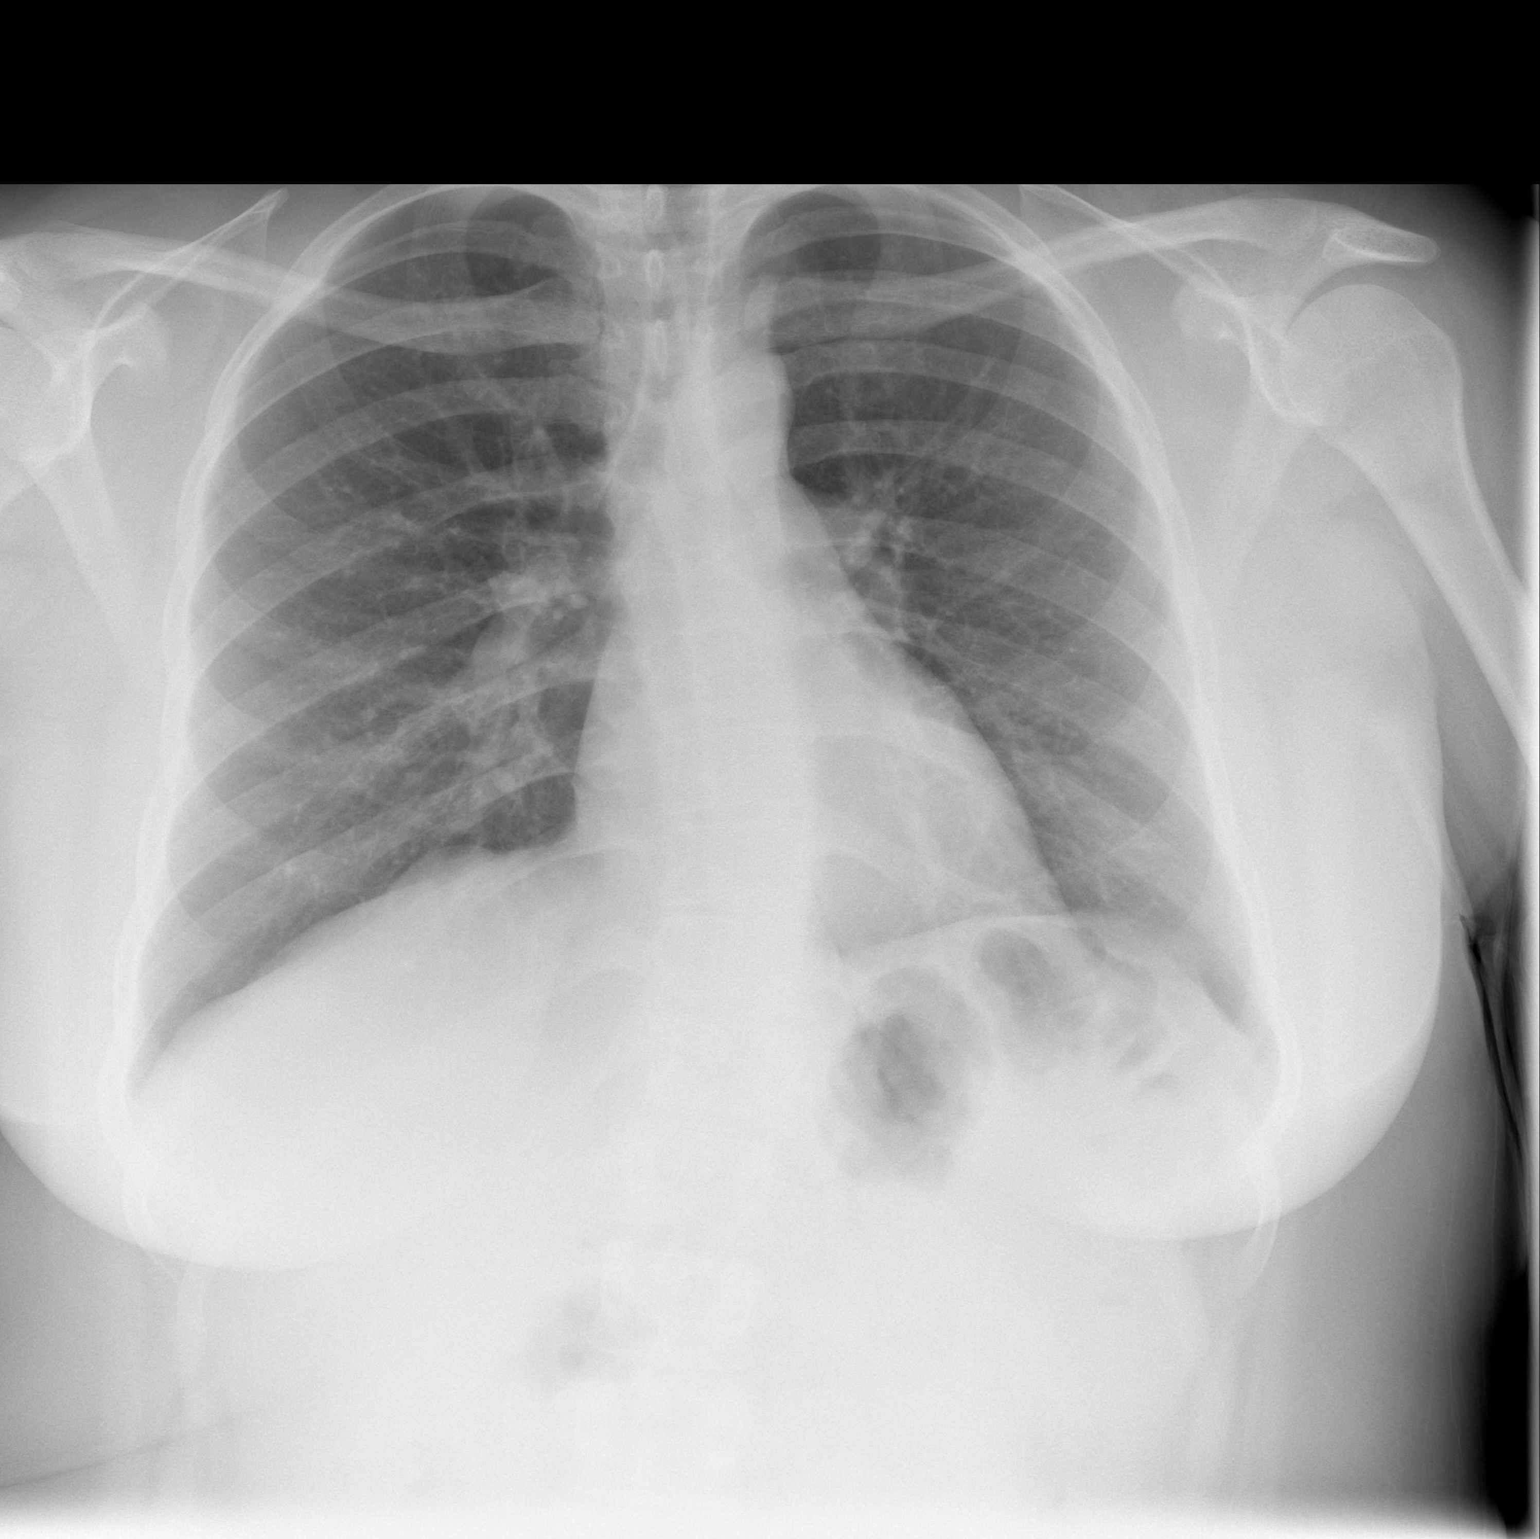

[w chest lat]
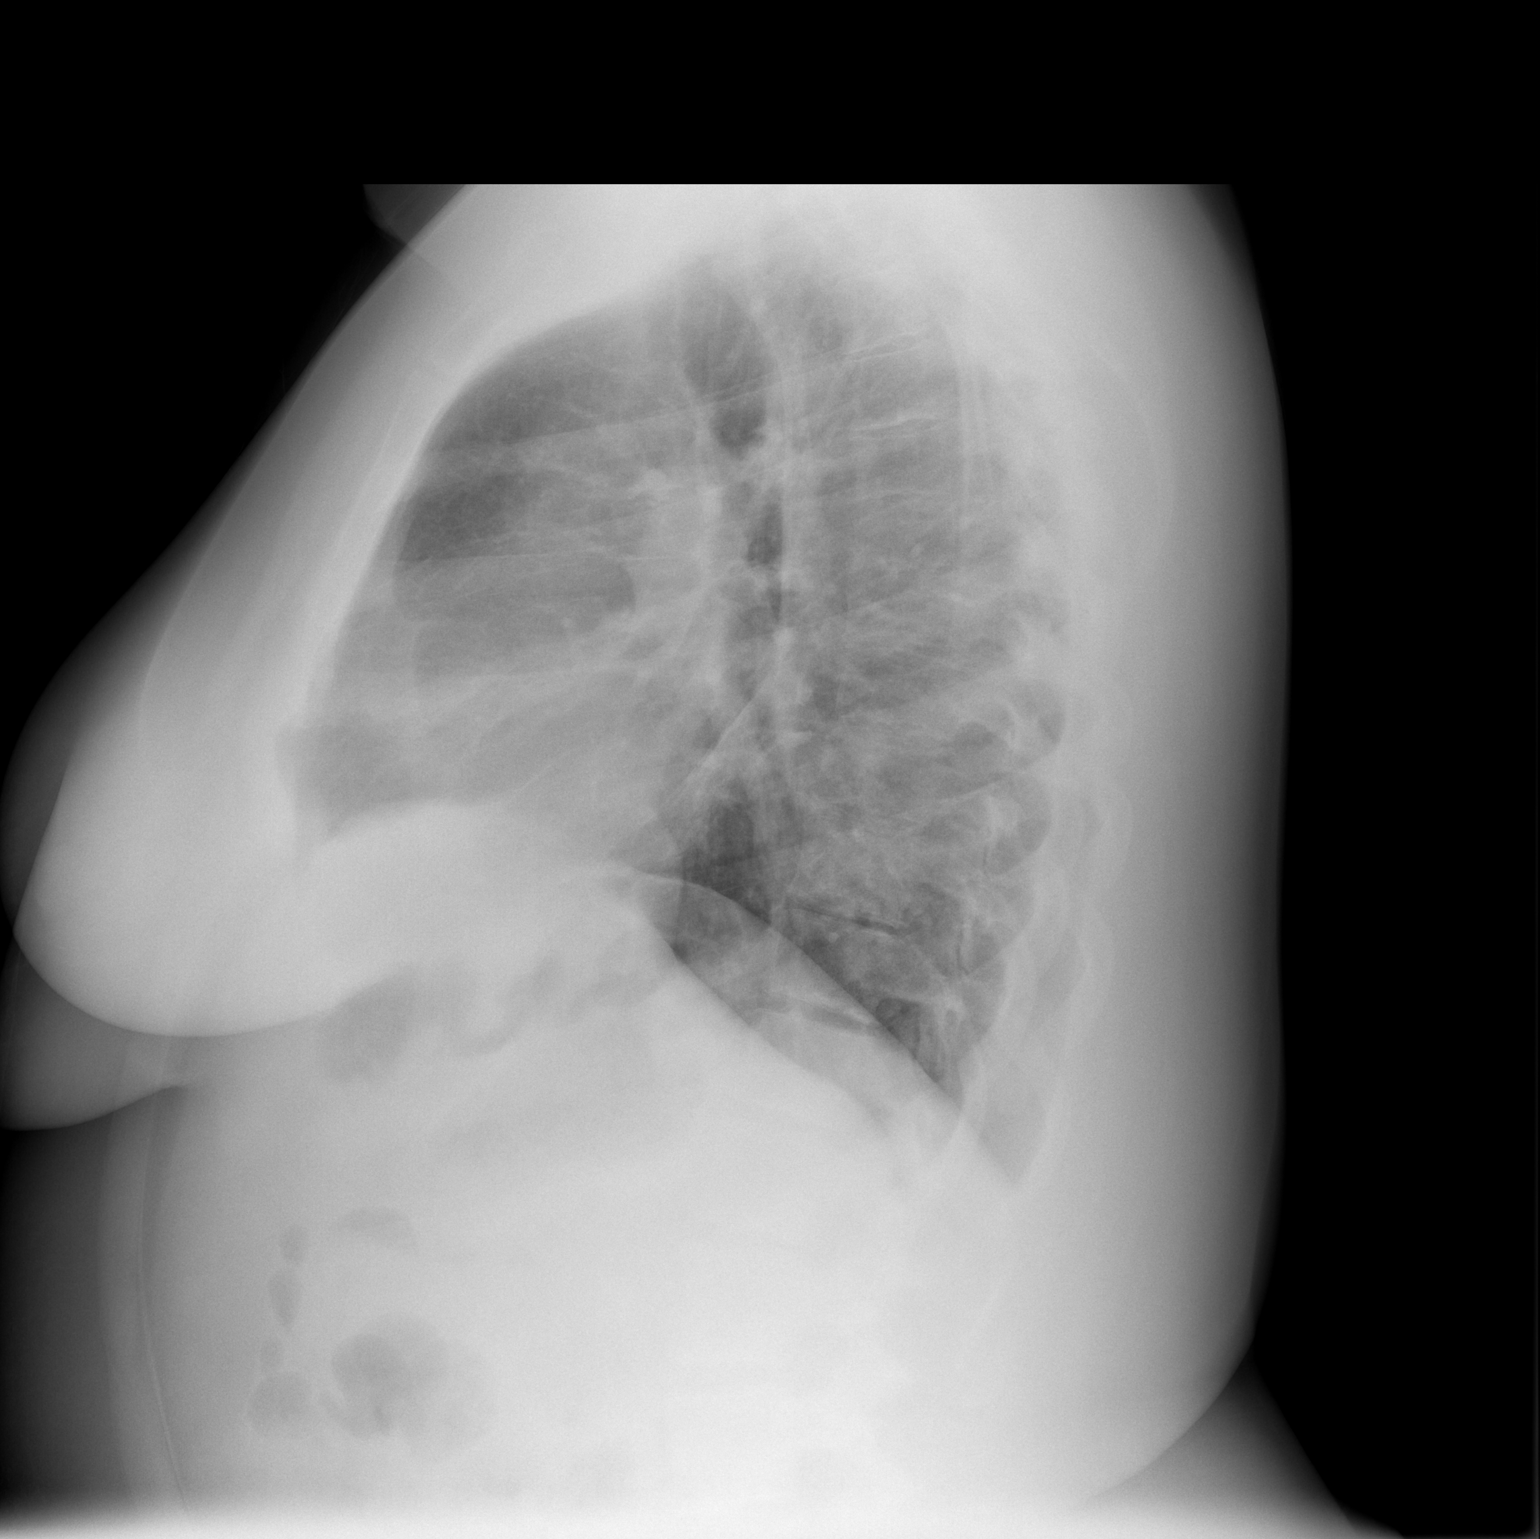

[2 of 2 positions shown; findings below may reference images not displayed]

FINDINGS: Lungs are clear.

Heart size and mediastinal contours are within normal limits.

No effusion.

Visualized bones unremarkable.
IMPRESSION: No acute cardiopulmonary disease.

## 2019-09-15 ENCOUNTER — Other Ambulatory Visit: Payer: Self-pay

## 2019-09-19 ENCOUNTER — Ambulatory Visit (INDEPENDENT_AMBULATORY_CARE_PROVIDER_SITE_OTHER): Payer: PRIVATE HEALTH INSURANCE | Admitting: Women's Health

## 2019-09-19 ENCOUNTER — Encounter: Payer: Self-pay | Admitting: Women's Health

## 2019-09-19 ENCOUNTER — Other Ambulatory Visit: Payer: Self-pay

## 2019-09-19 VITALS — BP 122/78 | Ht 67.0 in | Wt 279.0 lb

## 2019-09-19 DIAGNOSIS — Z01419 Encounter for gynecological examination (general) (routine) without abnormal findings: Secondary | ICD-10-CM

## 2019-09-19 DIAGNOSIS — N898 Other specified noninflammatory disorders of vagina: Secondary | ICD-10-CM | POA: Diagnosis not present

## 2019-09-19 DIAGNOSIS — N760 Acute vaginitis: Secondary | ICD-10-CM

## 2019-09-19 DIAGNOSIS — B9689 Other specified bacterial agents as the cause of diseases classified elsewhere: Secondary | ICD-10-CM

## 2019-09-19 DIAGNOSIS — Z113 Encounter for screening for infections with a predominantly sexual mode of transmission: Secondary | ICD-10-CM | POA: Diagnosis not present

## 2019-09-19 LAB — WET PREP FOR TRICH, YEAST, CLUE

## 2019-09-19 MED ORDER — METRONIDAZOLE 500 MG PO TABS: 500.0000 mg | ORAL_TABLET | Freq: Two times a day (BID) | ORAL | 0 refills | Status: DC

## 2019-09-19 NOTE — Patient Instructions (Addendum)
MVI daily Pleasure to know you! Health Maintenance, Female Adopting a healthy lifestyle and getting preventive care are important in promoting health and wellness. Ask your health care provider about:  The right schedule for you to have regular tests and exams.  Things you can do on your own to prevent diseases and keep yourself healthy. What should I know about diet, weight, and exercise? Eat a healthy diet   Eat a diet that includes plenty of vegetables, fruits, low-fat dairy products, and lean protein.  Do not eat a lot of foods that are high in solid fats, added sugars, or sodium. Maintain a healthy weight Body mass index (BMI) is used to identify weight problems. It estimates body fat based on height and weight. Your health care provider can help determine your BMI and help you achieve or maintain a healthy weight. Get regular exercise Get regular exercise. This is one of the most important things you can do for your health. Most adults should:  Exercise for at least 150 minutes each week. The exercise should increase your heart rate and make you sweat (moderate-intensity exercise).  Do strengthening exercises at least twice a week. This is in addition to the moderate-intensity exercise.  Spend less time sitting. Even light physical activity can be beneficial. Watch cholesterol and blood lipids Have your blood tested for lipids and cholesterol at 29 years of age, then have this test every 5 years. Have your cholesterol levels checked more often if:  Your lipid or cholesterol levels are high.  You are older than 29 years of age.  You are at high risk for heart disease. What should I know about cancer screening? Depending on your health history and family history, you may need to have cancer screening at various ages. This may include screening for:  Breast cancer.  Cervical cancer.  Colorectal cancer.  Skin cancer.  Lung cancer. What should I know about heart  disease, diabetes, and high blood pressure? Blood pressure and heart disease  High blood pressure causes heart disease and increases the risk of stroke. This is more likely to develop in people who have high blood pressure readings, are of African descent, or are overweight.  Have your blood pressure checked: ? Every 3-5 years if you are 29-24 years of age. ? Every year if you are 79 years old or older. Diabetes Have regular diabetes screenings. This checks your fasting blood sugar level. Have the screening done:  Once every three years after age 66 if you are at a normal weight and have a low risk for diabetes.  More often and at a younger age if you are overweight or have a high risk for diabetes. What should I know about preventing infection? Hepatitis B If you have a higher risk for hepatitis B, you should be screened for this virus. Talk with your health care provider to find out if you are at risk for hepatitis B infection. Hepatitis C Testing is recommended for:  Everyone born from [birth year] through 1965.  Anyone with known risk factors for hepatitis C. Sexually transmitted infections (STIs)  Get screened for STIs, including gonorrhea and chlamydia, if: ? You are sexually active and are younger than 29 years of age. ? You are older than 29 years of age and your health care provider tells you that you are at risk for this type of infection. ? Your sexual activity has changed since you were last screened, and you are at increased risk for chlamydia or gonorrhea.  Ask your health care provider if you are at risk.  Ask your health care provider about whether you are at high risk for HIV. Your health care provider may recommend a prescription medicine to help prevent HIV infection. If you choose to take medicine to prevent HIV, you should first get tested for HIV. You should then be tested every 3 months for as long as you are taking the medicine. Pregnancy  If you are about to stop  having your period (premenopausal) and you may become pregnant, seek counseling before you get pregnant.  Take 400 to 800 micrograms (mcg) of folic acid every day if you become pregnant.  Ask for birth control (contraception) if you want to prevent pregnancy. Osteoporosis and menopause Osteoporosis is a disease in which the bones lose minerals and strength with aging. This can result in bone fractures. If you are 44 years old or older, or if you are at risk for osteoporosis and fractures, ask your health care provider if you should:  Be screened for bone loss.  Take a calcium or vitamin D supplement to lower your risk of fractures.  Be given hormone replacement therapy (HRT) to treat symptoms of menopause. Follow these instructions at home: Lifestyle  Do not use any products that contain nicotine or tobacco, such as cigarettes, e-cigarettes, and chewing tobacco. If you need help quitting, ask your health care provider.  Do not use street drugs.  Do not share needles.  Ask your health care provider for help if you need support or information about quitting drugs. Alcohol use  Do not drink alcohol if: ? Your health care provider tells you not to drink. ? You are pregnant, may be pregnant, or are planning to become pregnant.  If you drink alcohol: ? Limit how much you use to 0-1 drink a day. ? Limit intake if you are breastfeeding.  Be aware of how much alcohol is in your drink. In the [place] one drink equals one 12 oz bottle of beer (355 mL), one 5 oz glass of wine (148 mL), or one 1 oz glass of hard liquor (44 mL). General instructions  Schedule regular health, dental, and eye exams.  Stay current with your vaccines.  Tell your health care provider if: ? You often feel depressed. ? You have ever been abused or do not feel safe at home. Summary  Adopting a healthy lifestyle and getting preventive care are important in promoting health and wellness.  Follow your health  care provider's instructions about healthy diet, exercising, and getting tested or screened for diseases.  Follow your health care provider's instructions on monitoring your cholesterol and blood pressure. This information is not intended to replace advice given to you by your health care provider. Make sure you discuss any questions you have with your health care provider. Document Revised: 05/26/2018 Document Reviewed: 05/26/2018 Elsevier Patient Education  [place].  Bacterial Vaginosis  Bacterial vaginosis is an infection of the vagina. It happens when too many normal germs (healthy bacteria) grow in the vagina. This infection puts you at risk for infections from sex (STIs). Treating this infection can lower your risk for some STIs. You should also treat this if you are pregnant. It can cause your baby to be born early. Follow these instructions at home: Medicines  Take over-the-counter and prescription medicines only as told by your doctor.  Take or use your antibiotic medicine as told by your doctor. Do not stop taking or using it even if  you start to feel better. General instructions  If you your sexual partner is a woman, tell her that you have this infection. She needs to get treatment if she has symptoms. If you have a female partner, he does not need to be treated.  During treatment: ? Avoid sex. ? Do not douche. ? Avoid alcohol as told. ? Avoid breastfeeding as told.  Drink enough fluid to keep your pee (urine) clear or pale yellow.  Keep your vagina and butt (rectum) clean. ? Wash the area with warm water every day. ? Wipe from front to back after you use the toilet.  Keep all follow-up visits as told by your doctor. This is important. Preventing this condition  Do not douche.  Use only warm water to wash around your vagina.  Use protection when you have sex. This includes: ? Latex condoms. ? Dental dams.  Limit how many people you have sex with. It is  best to only have sex with the same person (be monogamous).  Get tested for STIs. Have your partner get tested.  Wear underwear that is cotton or lined with cotton.  Avoid tight pants and pantyhose. This is most important in summer.  Do not use any products that have nicotine or tobacco in them. These include cigarettes and e-cigarettes. If you need help quitting, ask your doctor.  Do not use illegal drugs.  Limit how much alcohol you drink. Contact a doctor if:  Your symptoms do not get better, even after you are treated.  You have more discharge or pain when you pee (urinate).  You have a fever.  You have pain in your belly (abdomen).  You have pain with sex.  Your bleed from your vagina between periods. Summary  This infection happens when too many germs (bacteria) grow in the vagina.  Treating this condition can lower your risk for some infections from sex (STIs).  You should also treat this if you are pregnant. It can cause early (premature) birth.  Do not stop taking or using your antibiotic medicine even if you start to feel better. This information is not intended to replace advice given to you by your health care provider. Make sure you discuss any questions you have with your health care provider. Document Revised: 05/15/2017 Document Reviewed: 02/16/2016 Elsevier Patient Education  2020 [employer].

## 2019-09-19 NOTE — Progress Notes (Signed)
l °

## 2019-09-19 NOTE — Progress Notes (Signed)
Crystal Barton March 16, 1991 [id number]    History:    Presents for annual exam.  Monthly cycle/condoms.  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  Normal Pap after.  Gardasil series completed.  February 2021 right shoulder injury from work surgery currently in physical therapy.  Past medical history, past surgical history, family history and social history were all reviewed and documented in the EPIC chart.  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  Parents healthy.  ROS:  A ROS was performed and pertinent positives and negatives are included.  Exam:  Vitals:   09/19/19 1509  BP: 122/78  Weight: 279 lb (126.6 kg)  Height: 5\' 7"  (1.702 m)   Body mass index is 43.7 kg/m.   General appearance:  Normal Thyroid:  Symmetrical, normal in size, without palpable masses or nodularity. Respiratory  Auscultation:  Clear without wheezing or rhonchi Cardiovascular  Auscultation:  Regular rate, without rubs, murmurs or gallops  Edema/varicosities:  Not grossly evident Abdominal  Soft,nontender, without masses, guarding or rebound.  Liver/spleen:  No organomegaly noted  Hernia:  None appreciated  Skin  Inspection:  Grossly normal   Breasts: Examined lying and sitting.     Right: Without masses, retractions, discharge or axillary adenopathy.     Left: Without masses, retractions, discharge or axillary adenopathy. Gentitourinary   Inguinal/mons:  Normal without inguinal adenopathy  External genitalia:  Normal  BUS/Urethra/Skene's glands:  Normal  Vagina: White adherent discharge, wet prep positive for clues, TNTC bacteria   Cervix:  Normal  Uterus:  normal in size, shape and contour.  Midline and mobile  Adnexa/parametria:     Rt: Without masses or tenderness.   Lt: Without masses or tenderness.  Anus and perineum: Normal   Assessment/Plan:  29 y.o. SBF G1P 0 for annual exam with no complaints of vaginal discharge, urinary symptoms, abdominal pain.  Reports vaginal  odor.  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  Flagyl 500 twice daily for 7 days, alcohol precautions.  Instructed to call if persistent odor.  SBEs, exercise, calcium rich foods, MVI daily encouraged.  Reviewed importance of increasing exercise and decreasing calories/carbs.  Contemplating weight loss surgery.    2019 [facility], 5:02 PM 09/19/2019

## 2019-09-20 ENCOUNTER — Other Ambulatory Visit: Payer: Self-pay | Admitting: Women's Health

## 2019-09-20 LAB — RPR: RPR Ser Ql: NONREACTIVE

## 2019-09-20 LAB — HIV ANTIBODY (ROUTINE TESTING W REFLEX): HIV 1&2 Ab, 4th Generation: NONREACTIVE

## 2019-09-21 LAB — URINE CULTURE
MICRO NUMBER:: 10329043
SPECIMEN QUALITY:: ADEQUATE

## 2019-09-21 LAB — URINALYSIS, COMPLETE W/RFL CULTURE
Bacteria, UA: NONE SEEN /HPF
Bilirubin Urine: NEGATIVE
Glucose, UA: NEGATIVE
Hyaline Cast: NONE SEEN /LPF
Ketones, ur: NEGATIVE
Leukocyte Esterase: NEGATIVE
Nitrites, Initial: NEGATIVE
Specific Gravity, Urine: 1.017 (ref 1.001–1.03)
pH: 6 (ref 5.0–8.0)

## 2019-09-21 LAB — CULTURE INDICATED

## 2019-09-22 LAB — PAP THINPREP ASCUS RFLX HPV RFLX TYPE
C. trachomatis RNA, TMA: NOT DETECTED
N. gonorrhoeae RNA, TMA: NOT DETECTED

## 2020-04-17 ENCOUNTER — Ambulatory Visit: Payer: Self-pay | Admitting: Family Medicine

## 2020-04-17 VITALS — BP 112/80 | HR 85

## 2020-04-17 DIAGNOSIS — H66003 Acute suppurative otitis media without spontaneous rupture of ear drum, bilateral: Secondary | ICD-10-CM

## 2020-04-17 MED ORDER — AMOXICILLIN-POT CLAVULANATE 875-125 MG PO TABS: 1.0000 | ORAL_TABLET | Freq: Two times a day (BID) | ORAL | 0 refills | Status: DC

## 2020-04-17 NOTE — Progress Notes (Signed)
°  Subjective:     Patient ID: Crystal Barton, female   DOB: 08/03/1990, 29 y.o.   MRN: [id number]  HPI  Maleyah presents to the [facility] health and wellness clinic for evaluation of cough and congestion x 6 days and 1 day h/o bilateral ear fullness and diminished hearing. She denies any known fever or shortness of breath. No known sick contacts.   She does have a h/o seasonal allergies that she usually takes zyrtec for but is not currently taking. She also reports trying flonase for 1 day but saw no relief so stopped taking it.   Past Medical History:  Diagnosis Date   Hypertension    STD (sexually transmitted disease)    Chlamydia   No Known Allergies  Current Outpatient Medications:    ibuprofen (ADVIL) 800 MG tablet, Take 800 mg by mouth 3 (three) times daily., Disp: , Rfl:    amoxicillin-clavulanate (AUGMENTIN) 875-125 MG tablet, Take 1 tablet by mouth 2 (two) times daily., Disp: 20 tablet, Rfl: 0   Review of Systems  Constitutional: Negative for activity change, appetite change, chills, fatigue and fever.  HENT: Positive for congestion, hearing loss and sinus pressure. Negative for ear discharge, ear pain and trouble swallowing.   Eyes: Negative for discharge, itching and visual disturbance.  Respiratory: Positive for cough. Negative for chest tightness, shortness of breath and wheezing.   Cardiovascular: Negative for chest pain.  Gastrointestinal: Negative for abdominal pain, diarrhea, nausea and vomiting.  Musculoskeletal: Negative for myalgias.  Skin: Negative for color change.        Objective:   Physical Exam Vitals reviewed.  Constitutional:      General: She is not in acute distress.    Appearance: Normal appearance. She is well-developed.  HENT:     Head: Normocephalic and atraumatic.     Right Ear: Tympanic membrane is erythematous and bulging.     Left Ear: Tympanic membrane is erythematous and bulging.     Nose:     Right Sinus: Frontal sinus tenderness  present.     Left Sinus: Frontal sinus tenderness present.  Eyes:     General:        Right eye: No discharge.        Left eye: No discharge.  Cardiovascular:     Rate and Rhythm: Normal rate and regular rhythm.     Heart sounds: Normal heart sounds.  Pulmonary:     Effort: Pulmonary effort is normal. No respiratory distress.     Breath sounds: Normal breath sounds.  Musculoskeletal:     Cervical back: Neck supple.  Skin:    General: Skin is warm and dry.  Neurological:     Mental Status: She is alert and oriented to person, place, and time.  Psychiatric:        Mood and Affect: Mood normal.        Behavior: Behavior normal.    Today's Vitals   04/17/20 1044  BP: 112/80  Pulse: 85  SpO2: 98%   There is no height or weight on file to calculate BMI.     Assessment:     Non-recurrent acute suppurative otitis media of both ears without spontaneous rupture of tympanic membranes      Plan:     1. Bilateral AOM. Will treat with augmentin x 10 days. Recommend restarting flonase and give it a few days to work. If symptoms worsen over the next several days instead of improving should f/u.

## 2020-04-17 NOTE — Patient Instructions (Signed)
Otitis Media, Adult  Otitis media means that the middle ear is red and swollen (inflamed) and full of fluid. The condition usually goes away on its own. Follow these instructions at home:  Take over-the-counter and prescription medicines only as told by your doctor.  If you were prescribed an antibiotic medicine, take it as told by your doctor. Do not stop taking the antibiotic even if you start to feel better.  Keep all follow-up visits as told by your doctor. This is important. Contact a doctor if:  You have bleeding from your nose.  There is a lump on your neck.  You are not getting better in 5 days.  You feel worse instead of better. Get help right away if:  You have pain that is not helped with medicine.  You have swelling, redness, or pain around your ear.  You get a stiff neck.  You cannot move part of your face (paralyzed).  You notice that the bone behind your ear hurts when you touch it.  You get a very bad headache. Summary  Otitis media means that the middle ear is red, swollen, and full of fluid.  This condition usually goes away on its own. In some cases, treatment may be needed.  If you were prescribed an antibiotic medicine, take it as told by your doctor. This information is not intended to replace advice given to you by your health care provider. Make sure you discuss any questions you have with your health care provider. Document Revised: 05/15/2017 Document Reviewed: 06/23/2016 Elsevier Patient Education  2020 Elsevier Inc.  

## 2020-04-25 ENCOUNTER — Ambulatory Visit (INDEPENDENT_AMBULATORY_CARE_PROVIDER_SITE_OTHER): Payer: PRIVATE HEALTH INSURANCE | Admitting: Psychology

## 2020-04-25 DIAGNOSIS — F509 Eating disorder, unspecified: Secondary | ICD-10-CM

## 2020-04-26 ENCOUNTER — Other Ambulatory Visit: Payer: Self-pay

## 2020-04-26 ENCOUNTER — Encounter: Payer: Self-pay | Admitting: Dietician

## 2020-04-26 ENCOUNTER — Encounter: Payer: PRIVATE HEALTH INSURANCE | Attending: General Surgery | Admitting: Dietician

## 2020-04-26 DIAGNOSIS — E669 Obesity, unspecified: Secondary | ICD-10-CM | POA: Diagnosis present

## 2020-04-26 NOTE — Progress Notes (Signed)
Nutrition Assessment for Bariatric Surgery Medical Nutrition Therapy   Patient was seen on 04/26/2020 for Pre-Operative Nutrition Assessment. Letter of approval faxed to [facility] bariatric surgery program coordinator on 04/26/2020.   Referral stated Supervised Weight Loss (SWL) visits needed: 0  Planned surgery: Sleeve Pt expectation of surgery: to lose weight and keep it off, to be healthier   NUTRITION ASSESSMENT   Anthropometrics  Start weight at [facility]: 284.5 lbs (date: 04/26/2020) Height: 67 in BMI: 44.6 kg/m2     Lifestyle & Dietary Hx States her mom had bariatric surgery, and she has been thinking about it for a few years. States her weight fluctuates greatly. States she is not a picky eater. May have a grilled chicken salad or sub sandwich to eat, and may snack on sweets.   Any previous deficiencies: no  Micronutrient Nutrition Focused Physical Exam: Hair: no issues observed Eyes: no issues observed Mouth: no issues observed Neck: no issues observed Nails: no issues observed Skin: no issues observed  24-Hr Dietary Recall First Meal: bacon + eggs + grits Snack: chips Second Meal: burger + fries Snack: candy Third Meal: fried chicken  Snack: - Beverages: soda, water, Gatorade   NUTRITION DIAGNOSIS  Overweight/obesity ([place]-3.3) related to past poor dietary habits and physical inactivity as evidenced by patient w/ planned Sleeve surgery following dietary guidelines for continued weight loss.    NUTRITION INTERVENTION  Nutrition counseling (C-1) and education (E-2) to facilitate bariatric surgery goals.  Pre-Op Goals Reviewed with the Patient . Track food and beverage intake (pen and paper, MyFitness Pal, Baritastic app, etc.) . Make healthy food choices while monitoring portion sizes . Consume 3 meals per day or try to eat every 3-5 hours . Avoid concentrated sugars and fried foods . Keep sugar & fat in the single digits per serving on food  labels . Practice CHEWING your food (aim for applesauce consistency) . Practice not drinking 15 minutes before, during, and 30 minutes after each meal and snack . Avoid all carbonated beverages (ex: soda, sparkling beverages)  . Limit caffeinated beverages (ex: coffee, tea, energy drinks) . Avoid all sugar-sweetened beverages (ex: regular soda, sports drinks)  . Avoid alcohol  . Aim for 64-100 ounces of FLUID daily (with at least half of fluid intake being plain water)  . Aim for at least 60-80 grams of PROTEIN daily . Look for a liquid protein source that contains ?15 g protein and ?5 g carbohydrate (ex: shakes, drinks, shots) . Make a list of non-food related activities . Physical activity is an important part of a healthy lifestyle so keep it moving! The goal is to reach 150 minutes of exercise per week, including cardiovascular and weight baring activity.  Handouts Provided Include  . Bariatric Surgery Nutrition Visits . Pre-Op Goals . Protein Shakes  Learning Style & Readiness for Change Teaching method utilized: Visual & Auditory  Demonstrated degree of understanding via: Teach Back  Barriers to learning/adherence to lifestyle change: None Identified   MONITORING & EVALUATION Dietary intake, weekly physical activity, body weight, and pre-op goals reached at next nutrition visit.   Next Steps Patient is to follow up at [facility] for Pre-Op Class (>2 weeks before surgery) for further nutrition education.

## 2020-04-30 ENCOUNTER — Ambulatory Visit (INDEPENDENT_AMBULATORY_CARE_PROVIDER_SITE_OTHER): Payer: PRIVATE HEALTH INSURANCE | Admitting: Psychology

## 2020-04-30 DIAGNOSIS — F509 Eating disorder, unspecified: Secondary | ICD-10-CM | POA: Diagnosis not present

## 2020-05-08 ENCOUNTER — Ambulatory Visit: Payer: PRIVATE HEALTH INSURANCE | Admitting: Psychology

## 2020-05-14 ENCOUNTER — Other Ambulatory Visit: Payer: Self-pay

## 2020-05-14 ENCOUNTER — Encounter: Payer: PRIVATE HEALTH INSURANCE | Admitting: Skilled Nursing Facility1

## 2020-05-14 DIAGNOSIS — E669 Obesity, unspecified: Secondary | ICD-10-CM | POA: Diagnosis not present

## 2020-05-14 NOTE — Progress Notes (Signed)
Pre-Operative Nutrition Class:  Appt start time: 4320   End time:  1830.  Patient was seen on 05/14/2020 for Pre-Operative Bariatric Surgery Education [facility]    Surgery date:  Surgery type: Sleeve Start weight at [facility]: 284.4 lbs Weight today: 289.6  Samples given per MNT protocol. Patient educated on appropriate usage: Procure Multivitamin Lot [id number] Exp:06/2021  Celebrate  Calcium  Lot # [id number] Exp:09/2021   Protein02 drink Shake Lot #[id number] Exp: 10/23  The following the learning objectives were met by the patient during this course:  Identify Pre-Op Dietary Goals and will begin 2 weeks pre-operatively  Identify appropriate sources of fluids and proteins   State protein recommendations and appropriate sources pre and post-operatively  Identify Post-Operative Dietary Goals and will follow for 2 weeks post-operatively  Identify appropriate multivitamin and calcium sources  Describe the need for physical activity post-operatively and will follow MD recommendations  State when to call healthcare provider regarding medication questions or post-operative complications  Handouts given during class include:  Pre-Op Bariatric Surgery Diet Handout  Protein Shake Handout  Post-Op Bariatric Surgery Nutrition Handout  [facility] Program Information Flyer  Support Group Information Flyer  [facility] Bariatric Supplements Price List  Follow-Up Plan: Patient will follow-up at [facility] 2 weeks post operatively for diet advancement per MD.

## 2020-05-28 ENCOUNTER — Encounter: Payer: PRIVATE HEALTH INSURANCE | Attending: General Surgery | Admitting: Skilled Nursing Facility1

## 2020-05-28 ENCOUNTER — Other Ambulatory Visit: Payer: Self-pay

## 2020-05-28 DIAGNOSIS — E669 Obesity, unspecified: Secondary | ICD-10-CM | POA: Insufficient documentation

## 2020-05-28 NOTE — Progress Notes (Signed)
Supervised Weight Loss Visit Bariatric Nutrition Education  Planned Surgery: sleeve Pt Expectation of Surgery/ Goals: to lose weight  1 out of 6 SWL Appointments   Star given previously: no  NUTRITION ASSESSMENT  Anthropometrics  Start weight at [facility]: 284.5 lbs (date: 04/26/2020) Today's weight: 287 lbs Weight change: +3 lbs (since previous visit on 04/26/2020) BMI: 44.95 kg/m2    Clinical  Medical Hx:  Medications: see list Labs:  Notable Signs/Symptoms:   Lifestyle & Dietary Hx  Pt states she was able to quite smoking but it has been hard. Pt states since stopping smoking she has been eating more. Pt states her weight keeps increasing. Pt states she does not understand why she has gained weight (pt does not believe the increase in eating to be the culprit to her weight gain). 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   Estimated daily fluid intake: unknwon oz Supplements:  Current average weekly physical activity: ADL's  24-Hr Dietary Recall First Meal: 2 bacon + egg +toast Snack: chips or pack of crackers Second Meal 1pm: fast food or chicken salad + crackers Snack: chips Third Meal 8:30-9pm: fast food  Snack:  Beverages: soda, gatorade, water  Estimated Energy Needs Calories: 1500   NUTRITION DIAGNOSIS  Overweight/obesity ([place]-3.3) related to past poor dietary habits and physical inactivity as evidenced by patient w/ planned sleeve surgery following dietary guidelines for continued weight loss.   NUTRITION INTERVENTION  Nutrition counseling (C-1) and education (E-2) to facilitate bariatric surgery goals.  Pre-Op Goals Progress & New Goals -Log everything you put into your mouth: hand written -start working out (no specifics given)   Handouts Provided Include    Learning Style & Readiness for Change Teaching method utilized: Visual & Auditory  Demonstrated degree of understanding via: Teach Back  Readiness Level:  precontemplative Barriers to learning/adherence to lifestyle change: unidentified at this time  RD's Notes for next Visit  . Assess pts adherence to chosen goals   MONITORING & EVALUATION Dietary intake, weekly physical activity, body weight, and pre-op goals in 1 month.   Next Steps  Patient is to return to [facility]

## 2020-06-05 ENCOUNTER — Ambulatory Visit: Payer: PRIVATE HEALTH INSURANCE | Admitting: Family Medicine

## 2020-06-05 VITALS — BP 110/76

## 2020-06-05 DIAGNOSIS — R208 Other disturbances of skin sensation: Secondary | ICD-10-CM

## 2020-06-05 MED ORDER — IBUPROFEN 800 MG PO TABS: 800.0000 mg | ORAL_TABLET | Freq: Three times a day (TID) | ORAL | 2 refills | Status: DC | PRN

## 2020-06-05 NOTE — Progress Notes (Signed)
Subjective:     Patient ID: Crystal Barton, female   DOB: Nov 09, 1990, 29 y.o.   MRN: [id number]  HPI  C[facility] for evaluation of a "burning" feeling to her medial left ankle. She states this started about 4-5 months ago, it is intermittent occurring about 2x per week, but lasting all day on those days. She said it does not affect her at night. She denies any known injury. She denies any pain with walking. She denies any numbness or tingling to her feet. She states that being on her feet a lot seems to make it worse and resting is better.   Denies any hx of anemia, type 2 diabetes, or heart disease.   Past Medical History:  Diagnosis Date  . Hypertension   . STD (sexually transmitted disease)    Chlamydia   No Known Allergies  Current Outpatient Medications:  .  ibuprofen (ADVIL) 800 MG tablet, Take 1 tablet (800 mg total) by mouth 3 (three) times daily as needed for cramping., Disp: 30 tablet, Rfl: 2   Review of Systems  Constitutional: Negative for activity change, chills and fever.  HENT: Negative.   Eyes: Negative.   Respiratory: Negative.   Cardiovascular: Negative for leg swelling.  Endocrine: Negative for polydipsia, polyphagia and polyuria.  Musculoskeletal: Negative for gait problem.  Skin: Negative for color change, rash and wound.  Neurological: Negative for weakness and numbness.       Objective:   Physical Exam Vitals reviewed.  Constitutional:      General: She is not in acute distress.    Appearance: Normal appearance. She is well-developed.  HENT:     Head: Normocephalic and atraumatic.  Eyes:     General:        Right eye: No discharge.        Left eye: No discharge.  Cardiovascular:     Rate and Rhythm: Normal rate and regular rhythm.     Heart sounds: Normal heart sounds.  Pulmonary:     Effort: Pulmonary effort is normal. No respiratory distress.     Breath sounds: Normal breath sounds.   Musculoskeletal:     Cervical back: Neck supple.     Right ankle: Normal.     Left ankle: No swelling or deformity. No tenderness. Normal range of motion. Normal pulse.     Right foot: Normal.     Left foot: Normal.  Skin:    General: Skin is warm and dry.     Capillary Refill: Capillary refill takes less than 2 seconds.  Neurological:     Mental Status: She is alert and oriented to person, place, and time.     Sensory: Sensation is intact.     Motor: Motor function is intact.     Gait: Gait is intact.  Psychiatric:        Mood and Affect: Mood normal.        Behavior: Behavior normal.    Today's Vitals   06/05/20 1336  BP: 110/76   There is no height or weight on file to calculate BMI.     Assessment:     Burning sensation      Plan:     1. Recommend pt keep a log of her symptoms and any aggravating/alleviating factors.  2. May try wearing compression socks when she is on her feet a lot to see if this helps relieve that feeling. 3. Likely needs a neurologist for  further evaluation. Will need a referral from her PCP.  4. If symptoms become more frequent or severe recommend sooner f/u.

## 2020-07-04 ENCOUNTER — Encounter: Payer: PRIVATE HEALTH INSURANCE | Attending: General Surgery | Admitting: Skilled Nursing Facility1

## 2020-07-04 ENCOUNTER — Other Ambulatory Visit: Payer: Self-pay

## 2020-07-04 DIAGNOSIS — E669 Obesity, unspecified: Secondary | ICD-10-CM | POA: Insufficient documentation

## 2020-07-04 NOTE — Progress Notes (Addendum)
Supervised Weight Loss Visit Bariatric Nutrition Education  Planned Surgery: sleeve Pt Expectation of Surgery/ Goals: to lose weight  2 out of 6 SWL Appointments   Star given previously: no  NUTRITION ASSESSMENT  Anthropometrics  Start weight at [facility]: 284.5 lbs (date: 04/26/2020) Today's weight: 283 lbs Weight change: -4 lbs (since previous visit) BMI: 44.47 kg/m2    Clinical  Medical Hx:  Medications: see list Labs:  Notable Signs/Symptoms:   Lifestyle & Dietary Hx   Pt states from logging herself she realized she snacks all the time on calorically dense foods and eats more fast food than she realized. Pt states she has started making her meals and bring for lunch. Pt states she stopped drinking soda. Pt states she had a bit of a cold last month. Pt states she is really bene trying not to smoke and with cutting back on snacks it has been harder. Pt states she noticed how she used to eat made her feel tired stating she has way more energy with eating this way. Pt states she tried to eat closer to 8pm rather than 9 or 10pm.   Estimated daily fluid intake: unknwon oz Supplements:  Current average weekly physical activity: ADL's  24-Hr Dietary Recall First Meal: 2 bacon + egg +toast or cereal Snack: fruit or yogurt Second Meal 1pm: salad or sandwich + chips Snack: chips or peanut butter crackers Third Meal 8:30-9pm: grilled chicken or salmon + rice + brussels  Snack:  Beverages: gatorade, water  Estimated Energy Needs Calories: 1500  NUTRITION DIAGNOSIS  Overweight/obesity ([place]-3.3) related to past poor dietary habits and physical inactivity as evidenced by patient w/ planned sleeve surgery following dietary guidelines for continued weight loss.   NUTRITION INTERVENTION  Nutrition counseling (C-1) and education (E-2) to facilitate bariatric surgery goals.  Pre-Op Goals Progress & New Goals -Log everything you put into your mouth: hand written -start working out some  ideas coming    Handouts Provided Include    Learning Style & Readiness for Change Teaching method utilized: Visual & Auditory  Demonstrated degree of understanding via: Teach Back  Readiness Level: action Barriers to learning/adherence to lifestyle change: unidentified at this time  RD's Notes for next Visit  . Assess pts adherence to chosen goals   MONITORING & EVALUATION Dietary intake, weekly physical activity, body weight, and pre-op goals in 1 month.   Next Steps  Patient is to return to [facility]

## 2020-08-01 ENCOUNTER — Encounter: Payer: PRIVATE HEALTH INSURANCE | Attending: General Surgery | Admitting: Skilled Nursing Facility1

## 2020-08-01 ENCOUNTER — Other Ambulatory Visit: Payer: Self-pay

## 2020-08-01 DIAGNOSIS — E669 Obesity, unspecified: Secondary | ICD-10-CM | POA: Diagnosis not present

## 2020-08-01 NOTE — Progress Notes (Addendum)
Supervised Weight Loss Visit Bariatric Nutrition Education  Planned Surgery: sleeve Pt Expectation of Surgery/ Goals: to lose weight  3 out of 6 SWL Appointments   Star given previously: no  NUTRITION ASSESSMENT  Anthropometrics  Start weight at [facility]: 284.5 lbs (date: 04/26/2020) Today's weight: 284 lbs Weight change: +1 lbs (since previous visit) BMI: 44.59 kg/m2    Clinical  Medical Hx:  Medications: see list Labs:  Notable Signs/Symptoms:   Lifestyle & Dietary Hx   Continues to not smoke  Pt state she has joined the gym now doing it about 2-3 times a week. Pt states she does some weights but having had shoulder surgery last year she take st easy.   advised to be aware of not having options  Pt states she splits her friends meal plan company for options  Pt states she is still able to not smoke  Spending her day getting stuff done and being productive  Estimated daily fluid intake: unknwon oz Supplements:  Current average weekly physical activity: ADL's  24-Hr Dietary Recall First Meal: 2 bacon + egg + toast or cereal Snack: fruit or yogurt + fruit or chex mix Second Meal 1pm: salad or chicken salad + crackers or sometimes fast food burger Snack: chips or peanut butter crackers Third Meal 8:30-9pm: grilled chicken or salmon + rice + brussels  Snack:  Beverages: gatorade, water, diluted juice  Estimated Energy Needs Calories: 1500  NUTRITION DIAGNOSIS  Overweight/obesity ([place]-3.3) related to past poor dietary habits and physical inactivity as evidenced by patient w/ planned sleeve surgery following dietary guidelines for continued weight loss.   NUTRITION INTERVENTION  Nutrition counseling (C-1) and education (E-2) to facilitate bariatric surgery goals.  Pre-Op Goals Progress & New Goals:  Continue: Log everything you put into your mouth: hand written Continue: start working out some ideas coming  NEW: Be sure to continue to try new foods and recipes  so you do not feel you do not have enough variety NEW: try Gatorade zero diluted with water  Handouts Provided Include    Learning Style & Readiness for Change Teaching method utilized: Visual & Auditory  Demonstrated degree of understanding via: Teach Back  Readiness Level: action Barriers to learning/adherence to lifestyle change: unidentified at this time  RD's Notes for next Visit  . Assess pts adherence to chosen goals   MONITORING & EVALUATION Dietary intake, weekly physical activity, body weight, and pre-op goals in 1 month.   Next Steps  Patient is to return to [facility]

## 2020-08-29 ENCOUNTER — Other Ambulatory Visit: Payer: Self-pay

## 2020-08-29 ENCOUNTER — Encounter: Payer: PRIVATE HEALTH INSURANCE | Attending: General Surgery | Admitting: Skilled Nursing Facility1

## 2020-08-29 DIAGNOSIS — E669 Obesity, unspecified: Secondary | ICD-10-CM | POA: Diagnosis not present

## 2020-08-29 NOTE — Progress Notes (Signed)
Supervised Weight Loss Visit Bariatric Nutrition Education  Planned Surgery: sleeve Pt Expectation of Surgery/ Goals: to lose weight  4 out of 6 SWL Appointments   Star given previously: no  NUTRITION ASSESSMENT  Anthropometrics  Start weight at [facility]: 284.5 lbs (date: 04/26/2020) Today's weight: 283 lbs Weight change: -1 lbs (since previous visit) BMI: 44.37 kg/m2    Clinical  Medical Hx:  Medications: see list Labs:  Notable Signs/Symptoms:   Lifestyle & Dietary Hx   Continues to not smoke   Pt states her allergies have been acting. Pt states she has been reducing her meals out. Pt states she has been doing meal planning with her friend. Pt states she tries to choose baked chips and has been limiting her sweets. Pt states she has cut out the regular gatorade switched to zero kind. Pt states she has made her later meal smaller to not lay on it at night.   Estimated daily fluid intake: unknwon oz Supplements:  Current average weekly physical activity: gym with her cousin 2 days a week  24-Hr Dietary Recall First Meal: 2 bacon + egg + toast or cereal or yogurt or bagel Snack: fruit or yogurt + fruit or chex mix Second Meal 1pm: salad or chicken salad + crackers or sometimes fast food burger or sub without bread Snack: chips or peanut butter crackers Third Meal 8:30-9pm: grilled chicken or salmon + rice + brussels or sandwich Snack:  Beverages: gatorade 0, water, diluted juice  Estimated Energy Needs Calories: 1500  NUTRITION DIAGNOSIS  Overweight/obesity ([place]-3.3) related to past poor dietary habits and physical inactivity as evidenced by patient w/ planned sleeve surgery following dietary guidelines for continued weight loss.   NUTRITION INTERVENTION  Nutrition counseling (C-1) and education (E-2) to facilitate bariatric surgery goals.  Pre-Op Goals Progress & New Goals:  Continue: Log everything you put into your mouth: hand written Continue: start working out  some ideas coming  contonue: Be sure to continue to try new foods and recipes so you do not feel you do not have enough variety continue: try Gatorade zero diluted with water NEW add in another day of working out  [employer] Provided Include    Learning Style & Readiness for Change Teaching method utilized: Visual & Auditory  Demonstrated degree of understanding via: Teach Back  Readiness Level: action Barriers to learning/adherence to lifestyle change: unidentified at this time  RD's Notes for next Visit  . Assess pts adherence to chosen goals   MONITORING & EVALUATION Dietary intake, weekly physical activity, body weight, and pre-op goals in 1 month.   Next Steps  Patient is to return to [facility]

## 2020-09-26 ENCOUNTER — Other Ambulatory Visit: Payer: Self-pay

## 2020-09-26 ENCOUNTER — Encounter: Payer: PRIVATE HEALTH INSURANCE | Attending: General Surgery | Admitting: Skilled Nursing Facility1

## 2020-09-26 DIAGNOSIS — E669 Obesity, unspecified: Secondary | ICD-10-CM | POA: Insufficient documentation

## 2020-09-26 NOTE — Progress Notes (Signed)
Supervised Weight Loss Visit Bariatric Nutrition Education  Planned Surgery: sleeve Pt Expectation of Surgery/ Goals: to lose weight  5 out of 6 SWL Appointments   Star given previously: no; intrinsic motivation   NUTRITION ASSESSMENT  Anthropometrics  Start weight at [facility]: 284.5 lbs (date: 04/26/2020) Today's weight: 281.9 lbs Weight change: -2 lbs (since previous visit) BMI: 44.15 kg/m2    Clinical  Medical Hx:  Medications: see list Labs:  Notable Signs/Symptoms:   Lifestyle & Dietary Hx   Continues to not smoke   Pt states she has been reducing her meals out.   Pt states she was not able to add in another day of working out still working out though. Pt states she has cut out chips. Pt state she has cut out eating late at night.  Pt states she still wants to add another day in the gym. Pt states she only eats to make the hunger go away rather than very full.   Estimated daily fluid intake: unknwon oz Supplements:  Current average weekly physical activity: gym with her cousin 2 days a week 60 minutes   24-Hr Dietary Recall: continued First Meal: 2 bacon + egg + toast or cereal or yogurt or bagel Snack: fruit or yogurt + fruit or chex mix Second Meal 1pm: salad or chicken salad + crackers or sometimes fast food burger or sub without bread Snack:  peanut butter crackers Third Meal 8:30-9pm: grilled chicken or salmon + rice + brussels or sandwich (preplanned meals with her friend) Snack:  Beverages: gatorade 0, water, diluted juice  Estimated Energy Needs Calories: 1500  NUTRITION DIAGNOSIS  Overweight/obesity (-3.3) related to past poor dietary habits and physical inactivity as evidenced by patient w/ planned sleeve surgery following dietary guidelines for continued weight loss.   NUTRITION INTERVENTION  Nutrition counseling (C-1) and education (E-2) to facilitate bariatric surgery goals.  Pre-Op Goals Progress & New Goals:  Continue: Log everything you  put into your mouth: hand written Continue: start working out some ideas coming  contonue: Be sure to continue to try new foods and recipes so you do not feel you do not have enough variety continue: try Gatorade zero diluted with water Continue: add in another day of working out  [employer] Provided Include  N/A  Learning Style & Readiness for Change Teaching method utilized: Visual & Auditory  Demonstrated degree of understanding via: Teach Back  Readiness Level: action Barriers to learning/adherence to lifestyle change: unidentified at this time  RD's Notes for next Visit  . Assess pts adherence to chosen goals   MONITORING & EVALUATION Dietary intake, weekly physical activity, body weight, and pre-op goals in 1 month.   Next Steps  Patient is to return to [facility] for next SW[facility]

## 2020-10-10 ENCOUNTER — Ambulatory Visit (INDEPENDENT_AMBULATORY_CARE_PROVIDER_SITE_OTHER): Payer: PRIVATE HEALTH INSURANCE | Admitting: Psychology

## 2020-10-10 DIAGNOSIS — F509 Eating disorder, unspecified: Secondary | ICD-10-CM

## 2020-10-24 ENCOUNTER — Other Ambulatory Visit: Payer: Self-pay

## 2020-10-24 ENCOUNTER — Ambulatory Visit (INDEPENDENT_AMBULATORY_CARE_PROVIDER_SITE_OTHER): Payer: PRIVATE HEALTH INSURANCE | Admitting: Nurse Practitioner

## 2020-10-24 ENCOUNTER — Encounter: Payer: Self-pay | Admitting: Nurse Practitioner

## 2020-10-24 VITALS — BP 120/80 | HR 84 | Resp 16 | Ht 67.25 in | Wt 280.0 lb

## 2020-10-24 DIAGNOSIS — Z01419 Encounter for gynecological examination (general) (routine) without abnormal findings: Secondary | ICD-10-CM

## 2020-10-24 NOTE — Patient Instructions (Signed)
Health Maintenance, Female Adopting a healthy lifestyle and getting preventive care are important in promoting health and wellness. Ask your health care provider about:  The right schedule for you to have regular tests and exams.  Things you can do on your own to prevent diseases and keep yourself healthy. What should I know about diet, weight, and exercise? Eat a healthy diet  Eat a diet that includes plenty of vegetables, fruits, low-fat dairy products, and lean protein.  Do not eat a lot of foods that are high in solid fats, added sugars, or sodium.   Maintain a healthy weight Body mass index (BMI) is used to identify weight problems. It estimates body fat based on height and weight. Your health care provider can help determine your BMI and help you achieve or maintain a healthy weight. Get regular exercise Get regular exercise. This is one of the most important things you can do for your health. Most adults should:  Exercise for at least 150 minutes each week. The exercise should increase your heart rate and make you sweat (moderate-intensity exercise).  Do strengthening exercises at least twice a week. This is in addition to the moderate-intensity exercise.  Spend less time sitting. Even light physical activity can be beneficial. Watch cholesterol and blood lipids Have your blood tested for lipids and cholesterol at 30 years of age, then have this test every 5 years. Have your cholesterol levels checked more often if:  Your lipid or cholesterol levels are high.  You are older than 30 years of age.  You are at high risk for heart disease. What should I know about cancer screening? Depending on your health history and family history, you may need to have cancer screening at various ages. This may include screening for:  Breast cancer.  Cervical cancer.  Colorectal cancer.  Skin cancer.  Lung cancer. What should I know about heart disease, diabetes, and high blood  pressure? Blood pressure and heart disease  High blood pressure causes heart disease and increases the risk of stroke. This is more likely to develop in people who have high blood pressure readings, are of African descent, or are overweight.  Have your blood pressure checked: ? Every 3-5 years if you are 18-39 years of age. ? Every year if you are 40 years old or older. Diabetes Have regular diabetes screenings. This checks your fasting blood sugar level. Have the screening done:  Once every three years after age 40 if you are at a normal weight and have a low risk for diabetes.  More often and at a younger age if you are overweight or have a high risk for diabetes. What should I know about preventing infection? Hepatitis B If you have a higher risk for hepatitis B, you should be screened for this virus. Talk with your health care provider to find out if you are at risk for hepatitis B infection. Hepatitis C Testing is recommended for:  Everyone born from 1945 through 1965.  Anyone with known risk factors for hepatitis C. Sexually transmitted infections (STIs)  Get screened for STIs, including gonorrhea and chlamydia, if: ? You are sexually active and are younger than 30 years of age. ? You are older than 30 years of age and your health care provider tells you that you are at risk for this type of infection. ? Your sexual activity has changed since you were last screened, and you are at increased risk for chlamydia or gonorrhea. Ask your health care provider   if you are at risk.  Ask your health care provider about whether you are at high risk for HIV. Your health care provider may recommend a prescription medicine to help prevent HIV infection. If you choose to take medicine to prevent HIV, you should first get tested for HIV. You should then be tested every 3 months for as long as you are taking the medicine. Pregnancy  If you are about to stop having your period (premenopausal) and  you may become pregnant, seek counseling before you get pregnant.  Take 400 to 800 micrograms (mcg) of folic acid every day if you become pregnant.  Ask for birth control (contraception) if you want to prevent pregnancy. Osteoporosis and menopause Osteoporosis is a disease in which the bones lose minerals and strength with aging. This can result in bone fractures. If you are 65 years old or older, or if you are at risk for osteoporosis and fractures, ask your health care provider if you should:  Be screened for bone loss.  Take a calcium or vitamin D supplement to lower your risk of fractures.  Be given hormone replacement therapy (HRT) to treat symptoms of menopause. Follow these instructions at home: Lifestyle  Do not use any products that contain nicotine or tobacco, such as cigarettes, e-cigarettes, and chewing tobacco. If you need help quitting, ask your health care provider.  Do not use street drugs.  Do not share needles.  Ask your health care provider for help if you need support or information about quitting drugs. Alcohol use  Do not drink alcohol if: ? Your health care provider tells you not to drink. ? You are pregnant, may be pregnant, or are planning to become pregnant.  If you drink alcohol: ? Limit how much you use to 0-1 drink a day. ? Limit intake if you are breastfeeding.  Be aware of how much alcohol is in your drink. In the [place] one drink equals one 12 oz bottle of beer (355 mL), one 5 oz glass of wine (148 mL), or one 1 oz glass of hard liquor (44 mL). General instructions  Schedule regular health, dental, and eye exams.  Stay current with your vaccines.  Tell your health care provider if: ? You often feel depressed. ? You have ever been abused or do not feel safe at home. Summary  Adopting a healthy lifestyle and getting preventive care are important in promoting health and wellness.  Follow your health care provider's instructions about healthy  diet, exercising, and getting tested or screened for diseases.  Follow your health care provider's instructions on monitoring your cholesterol and blood pressure. This information is not intended to replace advice given to you by your health care provider. Make sure you discuss any questions you have with your health care provider. Document Revised: 05/26/2018 Document Reviewed: 05/26/2018 Elsevier Patient Education  2021 Elsevier Inc.  

## 2020-10-24 NOTE — Progress Notes (Signed)
   Crystal Barton 1991/05/02 [id number]   History:  30 y.o. G1P0010 presents for annual exam without GYN complaints. Monthly cycles/condoms. 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 Received Gardasil. Plans to have gastric sleeve soon, date not set.   Gynecologic History Patient's last menstrual period was 10/05/2020 (exact date).   Contraception/Family planning: condoms  Health Maintenance Last Pap: 09/19/2019. Results were: normal Last mammogram: Not indicated Last colonoscopy: Not indicated Last Dexa: Not indicated   Past medical history, past surgical history, family history and social history were all reviewed and documented in the EPIC chart. CNA at [employer].   ROS:  A ROS was performed and pertinent positives and negatives are included.  Exam:  Vitals:   10/24/20 1430  BP: 120/80  Pulse: 84  Resp: 16  Weight: 280 lb (127 kg)  Height: 5' 7.25" (1.708 m)   Body mass index is 43.53 kg/m.  General appearance:  Normal Thyroid:  Symmetrical, normal in size, without palpable masses or nodularity. Respiratory  Auscultation:  Clear without wheezing or rhonchi Cardiovascular  Auscultation:  Regular rate, without rubs, murmurs or gallops  Edema/varicosities:  Not grossly evident Abdominal  Soft,nontender, without masses, guarding or rebound.  Liver/spleen:  No organomegaly noted  Hernia:  None appreciated  Skin  Inspection:  Grossly normal Breasts: Examined lying and sitting.   Right: Without masses, retractions, nipple discharge or axillary adenopathy.   Left: Without masses, retractions, nipple discharge or axillary adenopathy. Genitourinary   Inguinal/mons:  Normal without inguinal adenopathy  External genitalia:  Normal appearing vulva with no masses, tenderness, or lesions  BUS/Urethra/Skene's glands:  Normal  Vagina:  Normal appearing with normal color and discharge, no lesions  Cervix:  Normal appearing without discharge or  lesions  Uterus:  Difficult to palpate due to body habitus but no gross masses or tenderness  Adnexa/parametria:     Rt: Normal in size, without masses or tenderness.   Lt: Normal in size, without masses or tenderness.  Anus and perineum: Normal  Assessment/Plan:  30 y.o. G1P0010 for annual exam.   Well woman exam with routine gynecological exam - Education provided on SBEs, importance of preventative screenings, current guidelines, high calcium diet, regular exercise, and multivitamin daily. Labs with PCP.   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 Will repeat at 3-year interval per guidelines.  Return in 1 year for annual.     [name] DNP, 2:50 PM 10/24/2020

## 2020-10-31 ENCOUNTER — Other Ambulatory Visit: Payer: Self-pay

## 2020-10-31 ENCOUNTER — Encounter: Payer: PRIVATE HEALTH INSURANCE | Attending: General Surgery | Admitting: Skilled Nursing Facility1

## 2020-10-31 DIAGNOSIS — E669 Obesity, unspecified: Secondary | ICD-10-CM | POA: Insufficient documentation

## 2020-10-31 NOTE — Progress Notes (Signed)
Supervised Weight Loss Visit Bariatric Nutrition Education  Planned Surgery: sleeve Pt Expectation of Surgery/ Goals: to lose weight  6 out of 6 SWL Appointments   Pt completed visits.   Pt has cleared nutrition requirements.   Star given previously: no; intrinsic motivation   NUTRITION ASSESSMENT  Anthropometrics  Start weight at [facility]: 284.5 lbs (date: 04/26/2020) Today's weight: 283.6 lbs Weight change: +2 lbs (since previous visit) BMI: 44.42kg/m2    Clinical  Medical Hx:  Medications: see list Labs:  Notable Signs/Symptoms:   Lifestyle & Dietary Hx   Continues to not smoke  Pt states she feels really accomplished with her physical activity routine and her eating habits and feels that will stick even after surgery.   Pt states she does not foresee struggling with anything after surgery and is really confident.   Pt state she owes her success to really wanting the change for herself and tired of doing the same thing over and over again.   Pt states she tries to make lunch her biggest meal.   Estimated daily fluid intake: unknwon oz Supplements:  Current average weekly physical activity: gym with her cousin 2 days a week 60 minutes   24-Hr Dietary Recall: continued First Meal: 2 bacon + egg + toast or cereal or yogurt or bagel + fruit Snack: fruit or yogurt + fruit or chex mix Second Meal 1pm: salad or chicken salad + crackers or wraps Snack:  peanut butter crackers Third Meal 8:30-9pm: grilled chicken or salmon + rice + brussels or sandwich (preplanned meals with her friend) Snack:  Beverages: gatorade 0, water  Estimated Energy Needs Calories: 1500  NUTRITION DIAGNOSIS  Overweight/obesity (Reeves-3.3) related to past poor dietary habits and physical inactivity as evidenced by patient w/ planned sleeve surgery following dietary guidelines for continued weight loss.   NUTRITION INTERVENTION  Nutrition counseling (C-1) and education (E-2) to facilitate  bariatric surgery goals.  Pre-Op Goals Progress & New Goals:  Continue: Log everything you put into your mouth: hand written Continue: start working out some ideas coming  contonue: Be sure to continue to try new foods and recipes so you do not feel you do not have enough variety continue: try Gatorade zero diluted with water Continue: add in another day of working out  [employer] Provided Include  N/A  Learning Style & Readiness for Change Teaching method utilized: Visual & Auditory  Demonstrated degree of understanding via: Teach Back  Readiness Level: action Barriers to learning/adherence to lifestyle change: unidentified at this time  RD's Notes for next Visit  . Assess pts adherence to chosen goals   MONITORING & EVALUATION Dietary intake, weekly physical activity, body weight, and pre-op goals in 1 month.   Next Steps  Patient is to return to [facility] for pre-op class Pt has completed visits. No further supervised visits required/recomended

## 2020-12-07 ENCOUNTER — Ambulatory Visit: Payer: Self-pay | Admitting: General Surgery

## 2020-12-10 ENCOUNTER — Other Ambulatory Visit: Payer: Self-pay

## 2020-12-10 ENCOUNTER — Encounter: Payer: PRIVATE HEALTH INSURANCE | Attending: General Surgery | Admitting: Skilled Nursing Facility1

## 2020-12-10 DIAGNOSIS — E669 Obesity, unspecified: Secondary | ICD-10-CM | POA: Insufficient documentation

## 2020-12-10 NOTE — Progress Notes (Signed)
Pre-Operative Nutrition Class:    Patient was seen on 12/10/2020 for Pre-Operative Bariatric Surgery Education at [facility].    Surgery date: 12/31/2020 Surgery type: sleeve Start weight at [facility]: 284.5 Weight today: 284.8  Samples given per MNT protocol. Patient educated on appropriate usage: Protien 20 shake Lot: [id number] Exp: 10/26/21   Bariatric Advantage Calcium Lot # [id number] Exp: 02/23     procare Vitamins Calcium Lot # [id number] Exp: 01/23   Celebrate Protein Powder   Lot # [id number] Exp: 04/23    The following the learning objectives were met by the patient during this course: Identify Pre-Op Dietary Goals and will begin 2 weeks pre-operatively Identify appropriate sources of fluids and proteins  State protein recommendations and appropriate sources pre and post-operatively Identify Post-Operative Dietary Goals and will follow for 2 weeks post-operatively Identify appropriate multivitamin and calcium sources Describe the need for physical activity post-operatively and will follow MD recommendations State when to call healthcare provider regarding medication questions or post-operative complications When having a diagnosis of diabetes understanding hypoglycemia symptoms and the inclusion of 1 complex carbohydrate per meal  Handouts given during class include: Pre-Op Bariatric Surgery Diet Handout Protein Shake Handout Post-Op Bariatric Surgery Nutrition Handout BELT Program Information Flyer Support Group Information Flyer [facility] Bariatric Supplements Price List  Follow-Up Plan: Patient will follow-up at [facility] 2 weeks post operatively for diet advancement per MD.

## 2020-12-18 ENCOUNTER — Other Ambulatory Visit (HOSPITAL_COMMUNITY): Payer: Self-pay

## 2020-12-18 NOTE — Progress Notes (Addendum)
PCP - [facility] [address] Zollie Scale) all pt knew Cardiologist - no  PPM/ICD -  Device Orders -  Rep Notified -   Chest x-ray -  EKG - 12-19-20 Stress Test -  ECHO -  Cardiac Cath -   Sleep Study -  CPAP -   Fasting Blood Sugar -  Checks Blood Sugar _____ times a day  Blood Thinner Instructions: Aspirin Instructions:  ERAS Protcol - PRE-SURGERY G2-   COVID TEST- 12-27-20  Activity---Able to walk a flight of stairs without SOB Anesthesia review:  Hx of HTN no meds BP elevated at preop . Pt asymptomatic.158/105  Rica Mast, NP aware .Pt. Stated she would keep a check on her BP at home and see PCP if needed before surgery.  Patient denies shortness of breath, fever, cough and chest pain at PAT appointment   All instructions explained to the patient, with a verbal understanding of the material. Patient agrees to go over the instructions while at home for a better understanding. Patient also instructed to self quarantine after being tested for COVID-19. The opportunity to ask questions was provided.

## 2020-12-19 ENCOUNTER — Encounter (HOSPITAL_COMMUNITY)
Admission: RE | Admit: 2020-12-19 | Discharge: 2020-12-19 | Disposition: A | Discharge status: Home / Self Care | Payer: PRIVATE HEALTH INSURANCE | Source: Ambulatory Visit | Attending: General Surgery | Admitting: General Surgery

## 2020-12-19 ENCOUNTER — Encounter (HOSPITAL_COMMUNITY): Payer: Self-pay

## 2020-12-19 ENCOUNTER — Other Ambulatory Visit: Payer: Self-pay

## 2020-12-19 DIAGNOSIS — Z01818 Encounter for other preprocedural examination: Secondary | ICD-10-CM | POA: Diagnosis not present

## 2020-12-19 HISTORY — DX: Unspecified asthma, uncomplicated: J45.909

## 2020-12-19 LAB — COMPREHENSIVE METABOLIC PANEL
ALT: 34 U/L (ref 0–44)
AST: 30 U/L (ref 15–41)
Albumin: 3.8 g/dL (ref 3.5–5.0)
Alkaline Phosphatase: 64 U/L (ref 38–126)
Anion gap: 8 (ref 5–15)
BUN: 11 mg/dL (ref 6–20)
CO2: 26 mmol/L (ref 22–32)
Calcium: 8.7 mg/dL — ABNORMAL LOW (ref 8.9–10.3)
Chloride: 103 mmol/L (ref 98–111)
Creatinine, Ser: 0.67 mg/dL (ref 0.44–1.00)
GFR, Estimated: >60 mL/min (ref >60)
Glucose, Bld: 75 mg/dL (ref 70–99)
Potassium: 3.8 mmol/L (ref 3.5–5.1)
Sodium: 137 mmol/L (ref 135–145)
Total Bilirubin: 0.7 mg/dL (ref 0.3–1.2)
Total Protein: 7.6 g/dL (ref 6.5–8.1)

## 2020-12-19 LAB — CBC WITH DIFFERENTIAL/PLATELET
Abs Immature Granulocytes: 0.02 10*3/uL (ref 0.00–0.07)
Basophils Absolute: 0 10*3/uL (ref 0.0–0.1)
Basophils Relative: 1 %
Eosinophils Absolute: 0.1 10*3/uL (ref 0.0–0.5)
Eosinophils Relative: 2 %
HCT: 40.2 % (ref 36.0–46.0)
Hemoglobin: 13.1 g/dL (ref 12.0–15.0)
Immature Granulocytes: 0 %
Lymphocytes Relative: 36 %
Lymphs Abs: 1.9 10*3/uL (ref 0.7–4.0)
MCH: 28.6 pg (ref 26.0–34.0)
MCHC: 32.6 g/dL (ref 30.0–36.0)
MCV: 87.8 fL (ref 80.0–100.0)
Monocytes Absolute: 0.6 10*3/uL (ref 0.1–1.0)
Monocytes Relative: 11 %
Neutro Abs: 2.8 10*3/uL (ref 1.7–7.7)
Neutrophils Relative %: 50 %
Platelets: 336 10*3/uL (ref 150–400)
RBC: 4.58 MIL/uL (ref 3.87–5.11)
RDW: 13.2 % (ref 11.5–15.5)
WBC: 5.4 10*3/uL (ref 4.0–10.5)
nRBC: 0 % (ref 0.0–0.2)

## 2020-12-19 LAB — TYPE AND SCREEN
ABO/RH(D): O POS
Antibody Screen: NEGATIVE

## 2020-12-19 NOTE — Patient Instructions (Signed)
DUE TO COVID-19 ONLY ONE VISITOR IS ALLOWED TO COME WITH YOU AND STAY IN THE WAITING ROOM ONLY DURING PRE OP AND PROCEDURE DAY OF SURGERY.   TWO VISITOR  MAY VISIT WITH YOU AFTER SURGERY IN YOUR PRIVATE ROOM DURING VISITING HOURS ONLY!  YOU NEED TO HAVE A COVID 19 TEST ON__7-14-22_____ @_______ , THIS TEST MUST BE DONE BEFORE SURGERY,    COVID TESTING SITE [address] [place] ,   IT IS ON THE RIGHT GOING OUT [address] APPROXIMATELY  2 MINUTES PAST [employer] ON THE RIGHT. ONCE YOUR COVID TEST IS COMPLETED,  PLEASE Wear a mask when in public           Your procedure is scheduled on: 12-31-20   Report to [facility] Main  Entrance   Report to admitting at      0800  AM     Call this number if you have problems the morning of surgery [phone]    Remember: MORNING OF SURGERY DRINK:   DRINK 1 G2 drink BEFORE YOU LEAVE HOME, DRINK ALL OF THE  G2 DRINK AT ONE TIME.    NO SOLID FOOD AFTER 600 PM THE NIGHT BEFORE YOUR SURGERY. YOU MAY DRINK CLEAR FLUIDS.    CLEAR LIQUID DIET   Foods Allowed                                                                     Foods Excluded water Black Coffee and tea, regular and decaf                             liquids that you cannot  Plain Jell-O any favor except red or purple                                               see through such as: Fruit ices (not with fruit pulp)                                                       milk, soups, orange juice  Iced Popsicles                                                       All solid food                                  Cranberry, grape and apple juices Sports drinks like Gatorade Lightly seasoned clear broth or consume(fat free) Sugar, honey syrup  Sample Menu Breakfast  Lunch                                     Supper Cranberry juice                    Beef broth                            Chicken broth Jell-O                                      Grape juice                           Apple juice Coffee or tea                        Jell-O                                      Popsicle                                                Coffee or tea                        Coffee or tea  _____________________________________________________________________       THE G2 DRINK YOU DRINK BEFORE YOU LEAVE HOME WILL BE THE LAST FLUIDS YOU DRINK BEFORE SURGERY.  PAIN IS EXPECTED AFTER SURGERY AND WILL NOT BE COMPLETELY ELIMINATED. AMBULATION AND TYLENOL WILL HELP REDUCE INCISIONAL AND GAS PAIN. MOVEMENT IS KEY!  YOU ARE EXPECTED TO BE OUT OF BED WITHIN 4 HOURS OF ADMISSION TO YOUR PATIENT ROOM.  SITTING IN THE RECLINER THROUGHOUT THE DAY IS IMPORTANT FOR DRINKING FLUIDS AND MOVING GAS THROUGHOUT THE GI TRACT.  COMPRESSION STOCKINGS SHOULD BE WORN [facility] STAY UNLESS YOU ARE WALKING.   INCENTIVE SPIROMETER SHOULD BE USED EVERY HOUR WHILE AWAKE TO DECREASE POST-OPERATIVE COMPLICATIONS SUCH AS PNEUMONIA.  WHEN DISCHARGED HOME, IT IS IMPORTANT TO CONTINUE TO WALK EVERY HOUR AND USE THE INCENTIVE SPIROMETER EVERY HOUR.       BRUSH YOUR TEETH MORNING OF SURGERY AND RINSE YOUR MOUTH OUT, NO CHEWING GUM CANDY OR MINTS.     Take these medicines the morning of surgery with A SIP OF WATER: none  DO NOT TAKE ANY DIABETIC MEDICATIONS DAY OF YOUR SURGERY                               You may not have any metal on your body including hair pins and              piercings  Do not wear jewelry, make-up, lotions, powders or perfumes, deodorant             Do not wear nail polish on your fingernails or toenails .  Do not shave  48 hours prior to surgery.  Do not bring valuables to the hospital. [facility] IS NOT             RESPONSIBLE   FOR VALUABLES.  Contacts, dentures or bridgework may not be worn into surgery.       Patients discharged the day of surgery will not be allowed to drive  home. IF YOU ARE HAVING SURGERY AND GOING HOME THE SAME DAY, YOU MUST HAVE AN ADULT TO DRIVE YOU HOME AND BE WITH YOU FOR 24 HOURS. YOU MAY GO HOME BY TAXI OR UBER OR ORTHERWISE, BUT AN ADULT MUST ACCOMPANY YOU HOME AND STAY WITH YOU FOR 24 HOURS.  Name and phone number of your driver:  Special Instructions: N/A              Please read over the following fact sheets you were given: _____________________________________________________________________             [facility] - Preparing for Surgery Before surgery, you can play an important role.  Because skin is not sterile, your skin needs to be as free of germs as possible.  You can reduce the number of germs on your skin by washing with CHG (chlorahexidine gluconate) soap before surgery.  CHG is an antiseptic cleaner which kills germs and bonds with the skin to continue killing germs even after washing. Please DO NOT use if you have an allergy to CHG or antibacterial soaps.  If your skin becomes reddened/irritated stop using the CHG and inform your nurse when you arrive at Short Stay. Do not shave (including legs and underarms) for at least 48 hours prior to the first CHG shower.  You may shave your face/neck. Please follow these instructions carefully:  1.  Shower with CHG Soap the night before surgery and the  morning of Surgery.  2.  If you choose to wash your hair, wash your hair first as usual with your  normal  shampoo.  3.  After you shampoo, rinse your hair and body thoroughly to remove the  shampoo.                           4.  Use CHG as you would any other liquid soap.  You can apply chg directly  to the skin and wash                       Gently with a scrungie or clean washcloth.  5.  Apply the CHG Soap to your body ONLY FROM THE NECK DOWN.   Do not use on face/ open                           Wound or open sores. Avoid contact with eyes, ears mouth and genitals (private parts).                       Wash face,  Genitals (private  parts) with your normal soap.             6.  Wash thoroughly, paying special attention to the area where your surgery  will be performed.  7.  Thoroughly rinse your body with warm water from the neck down.  8.  DO NOT shower/wash with your normal soap after using and rinsing off  the CHG Soap.  9.  Pat yourself dry with a clean towel.            10.  Wear clean pajamas.            11.  Place clean sheets on your bed the night of your first shower and do not  sleep with pets. Day of Surgery : Do not apply any lotions/deodorants the morning of surgery.  Please wear clean clothes to the hospital/surgery center.  FAILURE TO FOLLOW THESE INSTRUCTIONS MAY RESULT IN THE CANCELLATION OF YOUR SURGERY PATIENT SIGNATURE_________________________________  NURSE SIGNATURE__________________________________  __  Incentive Spirometer  An incentive spirometer is a tool that can help keep your lungs clear and active. This tool measures how well you are filling your lungs with each breath. Taking long deep breaths may help reverse or decrease the chance of developing breathing (pulmonary) problems (especially infection) following: A long period of time when you are unable to move or be active. BEFORE THE PROCEDURE  If the spirometer includes an indicator to show your best effort, your nurse or respiratory therapist will set it to a desired goal. If possible, sit up straight or lean slightly forward. Try not to slouch. Hold the incentive spirometer in an upright position. INSTRUCTIONS FOR USE  Sit on the edge of your bed if possible, or sit up as far as you can in bed or on a chair. Hold the incentive spirometer in an upright position. Breathe out normally. Place the mouthpiece in your mouth and seal your lips tightly around it. Breathe in slowly and as deeply as possible, raising the piston or the ball toward the top of the column. Hold your breath for 3-5 seconds or for as long as  possible. Allow the piston or ball to fall to the bottom of the column. Remove the mouthpiece from your mouth and breathe out normally. Rest for a few seconds and repeat Steps 1 through 7 at least 10 times every 1-2 hours when you are awake. Take your time and take a few normal breaths between deep breaths. The spirometer may include an indicator to show your best effort. Use the indicator as a goal to work toward during each repetition. After each set of 10 deep breaths, practice coughing to be sure your lungs are clear. If you have an incision (the cut made at the time of surgery), support your incision when coughing by placing a pillow or rolled up towels firmly against it. Once you are able to get out of bed, walk around indoors and cough well. You may stop using the incentive spirometer when instructed by your caregiver.  RISKS AND COMPLICATIONS Take your time so you do not get dizzy or light-headed. If you are in pain, you may need to take or ask for pain medication before doing incentive spirometry. It is harder to take a deep breath if you are having pain. AFTER USE Rest and breathe slowly and easily. It can be helpful to keep track of a log of your progress. Your caregiver can provide you with a simple table to help with this. If you are using the spirometer at home, follow these instructions: SEEK MEDICAL CARE IF:  You are having difficultly using the spirometer. You have trouble using the spirometer as often as instructed. Your pain medication is not giving enough relief while using the spirometer. You develop fever of 100.5 F (38.1 C) or higher. SEEK IMMEDIATE MEDICAL CARE IF:  You cough up bloody sputum that had not been  present before. You develop fever of 102 F (38.9 C) or greater. You develop worsening pain at or near the incision site. MAKE SURE YOU:  Understand these instructions. Will watch your condition. Will get help right away if you are not doing well or get  worse. Document Released: 10/13/2006 Document Revised: 08/25/2011 Document Reviewed: 12/14/2006 [facility] Patient Information 2014 [employer], [place].   ________________________________________________________________________ ______________________________________________________________________

## 2020-12-27 ENCOUNTER — Other Ambulatory Visit (HOSPITAL_COMMUNITY)
Admission: RE | Admit: 2020-12-27 | Discharge: 2020-12-27 | Disposition: A | Discharge status: Home / Self Care | Payer: PRIVATE HEALTH INSURANCE | Source: Ambulatory Visit | Attending: General Surgery | Admitting: General Surgery

## 2020-12-27 DIAGNOSIS — Z01812 Encounter for preprocedural laboratory examination: Secondary | ICD-10-CM | POA: Insufficient documentation

## 2020-12-27 DIAGNOSIS — Z20822 Contact with and (suspected) exposure to covid-19: Secondary | ICD-10-CM | POA: Insufficient documentation

## 2020-12-28 LAB — SARS CORONAVIRUS 2 (TAT 6-24 HRS): SARS Coronavirus 2: NEGATIVE

## 2020-12-30 MED ORDER — BUPIVACAINE LIPOSOME 1.3 % IJ SUSP
20.0000 mL | Freq: Once | INTRAMUSCULAR | Status: DC
  Filled 2020-12-30: qty 20

## 2020-12-31 ENCOUNTER — Other Ambulatory Visit: Payer: Self-pay

## 2020-12-31 ENCOUNTER — Encounter (HOSPITAL_COMMUNITY): Payer: Self-pay | Admitting: General Surgery

## 2020-12-31 ENCOUNTER — Inpatient Hospital Stay (HOSPITAL_COMMUNITY)
Admission: RE | Admit: 2020-12-31 | Discharge: 2021-01-01 | DRG: 621 | Disposition: A | Discharge status: Home / Self Care | Payer: PRIVATE HEALTH INSURANCE | Attending: General Surgery | Admitting: General Surgery

## 2020-12-31 ENCOUNTER — Inpatient Hospital Stay (HOSPITAL_COMMUNITY): Payer: PRIVATE HEALTH INSURANCE | Admitting: Emergency Medicine

## 2020-12-31 ENCOUNTER — Encounter (HOSPITAL_COMMUNITY)
Admission: RE | Disposition: A | Discharge status: Home / Self Care | Payer: Self-pay | Source: Home / Self Care | Attending: General Surgery

## 2020-12-31 ENCOUNTER — Inpatient Hospital Stay (HOSPITAL_COMMUNITY): Payer: PRIVATE HEALTH INSURANCE | Admitting: Certified Registered Nurse Anesthetist

## 2020-12-31 DIAGNOSIS — Z87891 Personal history of nicotine dependence: Secondary | ICD-10-CM | POA: Diagnosis not present

## 2020-12-31 DIAGNOSIS — J45909 Unspecified asthma, uncomplicated: Secondary | ICD-10-CM | POA: Diagnosis present

## 2020-12-31 DIAGNOSIS — I10 Essential (primary) hypertension: Secondary | ICD-10-CM | POA: Diagnosis present

## 2020-12-31 DIAGNOSIS — Z6841 Body Mass Index (BMI) 40.0 and over, adult: Secondary | ICD-10-CM

## 2020-12-31 HISTORY — PX: LAPAROSCOPIC GASTRIC SLEEVE RESECTION: SHX5895

## 2020-12-31 HISTORY — PX: UPPER GI ENDOSCOPY: SHX6162

## 2020-12-31 LAB — ABO/RH: ABO/RH(D): O POS

## 2020-12-31 LAB — HEMOGLOBIN AND HEMATOCRIT, BLOOD
HCT: 39.4 % (ref 36.0–46.0)
Hemoglobin: 12.4 g/dL (ref 12.0–15.0)

## 2020-12-31 LAB — PREGNANCY, URINE: Preg Test, Ur: NEGATIVE

## 2020-12-31 SURGERY — GASTRECTOMY, SLEEVE, LAPAROSCOPIC
Anesthesia: General

## 2020-12-31 MED ORDER — ACETAMINOPHEN 160 MG/5ML PO SOLN: 1000.0000 mg | Freq: Three times a day (TID) | ORAL | Status: DC

## 2020-12-31 MED ORDER — KETAMINE HCL 10 MG/ML IJ SOLN
INTRAMUSCULAR | Status: DC | PRN
  Administered 2020-12-31: 30 mg via INTRAVENOUS

## 2020-12-31 MED ORDER — LIDOCAINE 2% (20 MG/ML) 5 ML SYRINGE
INTRAMUSCULAR | Status: DC | PRN
  Administered 2020-12-31: 60 mg via INTRAVENOUS

## 2020-12-31 MED ORDER — FENTANYL CITRATE (PF) 100 MCG/2ML IJ SOLN
25.0000 ug | INTRAMUSCULAR | Status: DC | PRN
  Administered 2020-12-31 (×2): 50 ug via INTRAVENOUS

## 2020-12-31 MED ORDER — APREPITANT 40 MG PO CAPS
40.0000 mg | ORAL_CAPSULE | ORAL | Status: AC
  Administered 2020-12-31: 40 mg via ORAL
  Filled 2020-12-31: qty 1

## 2020-12-31 MED ORDER — SUGAMMADEX SODIUM 500 MG/5ML IV SOLN
INTRAVENOUS | Status: AC
  Filled 2020-12-31: qty 5

## 2020-12-31 MED ORDER — DEXAMETHASONE SODIUM PHOSPHATE 10 MG/ML IJ SOLN
INTRAMUSCULAR | Status: DC | PRN
  Administered 2020-12-31: 6 mg via INTRAVENOUS

## 2020-12-31 MED ORDER — LIDOCAINE 2% (20 MG/ML) 5 ML SYRINGE
INTRAMUSCULAR | Status: DC | PRN
  Administered 2020-12-31: 1.5 mg/kg/h via INTRAVENOUS

## 2020-12-31 MED ORDER — MIDAZOLAM HCL 5 MG/5ML IJ SOLN
INTRAMUSCULAR | Status: DC | PRN
  Administered 2020-12-31: 2 mg via INTRAVENOUS

## 2020-12-31 MED ORDER — LIDOCAINE 2% (20 MG/ML) 5 ML SYRINGE
INTRAMUSCULAR | Status: AC
  Filled 2020-12-31: qty 5

## 2020-12-31 MED ORDER — LABETALOL HCL 5 MG/ML IV SOLN
INTRAVENOUS | Status: AC
  Filled 2020-12-31: qty 4

## 2020-12-31 MED ORDER — ENSURE MAX PROTEIN PO LIQD
2.0000 [oz_av] | ORAL | Status: DC
  Administered 2021-01-01 (×5): 2 [oz_av] via ORAL

## 2020-12-31 MED ORDER — BUPIVACAINE HCL 0.25 % IJ SOLN
INTRAMUSCULAR | Status: DC | PRN
  Administered 2020-12-31: 30 mL

## 2020-12-31 MED ORDER — FENTANYL CITRATE (PF) 100 MCG/2ML IJ SOLN
INTRAMUSCULAR | Status: AC
  Filled 2020-12-31: qty 4

## 2020-12-31 MED ORDER — ROCURONIUM BROMIDE 10 MG/ML (PF) SYRINGE
PREFILLED_SYRINGE | INTRAVENOUS | Status: AC
  Filled 2020-12-31: qty 10

## 2020-12-31 MED ORDER — DEXTROSE-NACL 5-0.45 % IV SOLN
INTRAVENOUS | Status: DC
  Administered 2020-12-31: 1000 mL via INTRAVENOUS

## 2020-12-31 MED ORDER — ORAL CARE MOUTH RINSE: 15.0000 mL | Freq: Once | OROMUCOSAL | Status: AC

## 2020-12-31 MED ORDER — ROCURONIUM BROMIDE 10 MG/ML (PF) SYRINGE
PREFILLED_SYRINGE | INTRAVENOUS | Status: DC | PRN
  Administered 2020-12-31: 10 mg via INTRAVENOUS
  Administered 2020-12-31: 60 mg via INTRAVENOUS

## 2020-12-31 MED ORDER — DEXAMETHASONE SODIUM PHOSPHATE 10 MG/ML IJ SOLN
INTRAMUSCULAR | Status: AC
  Filled 2020-12-31: qty 1

## 2020-12-31 MED ORDER — BUPIVACAINE HCL 0.25 % IJ SOLN
INTRAMUSCULAR | Status: AC
  Filled 2020-12-31: qty 1

## 2020-12-31 MED ORDER — FENTANYL CITRATE (PF) 250 MCG/5ML IJ SOLN
INTRAMUSCULAR | Status: AC
  Filled 2020-12-31: qty 5

## 2020-12-31 MED ORDER — PROMETHAZINE HCL 25 MG/ML IJ SOLN: 6.2500 mg | INTRAMUSCULAR | Status: DC | PRN

## 2020-12-31 MED ORDER — ENOXAPARIN SODIUM 30 MG/0.3ML IJ SOSY
30.0000 mg | PREFILLED_SYRINGE | Freq: Two times a day (BID) | INTRAMUSCULAR | Status: DC
  Administered 2021-01-01: 30 mg via SUBCUTANEOUS
  Filled 2020-12-31: qty 0.3

## 2020-12-31 MED ORDER — SCOPOLAMINE 1 MG/3DAYS TD PT72
1.0000 | MEDICATED_PATCH | TRANSDERMAL | Status: DC
  Administered 2020-12-31: 1.5 mg via TRANSDERMAL
  Filled 2020-12-31: qty 1

## 2020-12-31 MED ORDER — LABETALOL HCL 5 MG/ML IV SOLN
10.0000 mg | Freq: Once | INTRAVENOUS | Status: AC
  Administered 2020-12-31: 10 mg via INTRAVENOUS

## 2020-12-31 MED ORDER — ONDANSETRON HCL 4 MG/2ML IJ SOLN: 4.0000 mg | INTRAMUSCULAR | Status: DC | PRN

## 2020-12-31 MED ORDER — LACTATED RINGERS IR SOLN
Status: DC | PRN
  Administered 2020-12-31: 1000 mL

## 2020-12-31 MED ORDER — OXYCODONE HCL 5 MG/5ML PO SOLN
5.0000 mg | Freq: Four times a day (QID) | ORAL | Status: DC | PRN
  Administered 2020-12-31 – 2021-01-01 (×3): 5 mg via ORAL
  Filled 2020-12-31 (×2): qty 5

## 2020-12-31 MED ORDER — MIDAZOLAM HCL 2 MG/2ML IJ SOLN
INTRAMUSCULAR | Status: AC
  Filled 2020-12-31: qty 2

## 2020-12-31 MED ORDER — CHLORHEXIDINE GLUCONATE CLOTH 2 % EX PADS: 6.0000 | MEDICATED_PAD | Freq: Once | CUTANEOUS | Status: DC

## 2020-12-31 MED ORDER — LACTATED RINGERS IV SOLN: INTRAVENOUS | Status: DC

## 2020-12-31 MED ORDER — ESMOLOL HCL 100 MG/10ML IV SOLN
INTRAVENOUS | Status: AC
  Filled 2020-12-31: qty 10

## 2020-12-31 MED ORDER — HEPARIN SODIUM (PORCINE) 5000 UNIT/ML IJ SOLN
5000.0000 [IU] | INTRAMUSCULAR | Status: AC
  Administered 2020-12-31: 5000 [IU] via SUBCUTANEOUS
  Filled 2020-12-31: qty 1

## 2020-12-31 MED ORDER — OXYCODONE HCL 5 MG/5ML PO SOLN
ORAL | Status: AC
  Filled 2020-12-31: qty 5

## 2020-12-31 MED ORDER — LABETALOL HCL 5 MG/ML IV SOLN
INTRAVENOUS | Status: DC | PRN
  Administered 2020-12-31: 5 mg via INTRAVENOUS

## 2020-12-31 MED ORDER — FAMOTIDINE IN NACL 20-0.9 MG/50ML-% IV SOLN
20.0000 mg | Freq: Two times a day (BID) | INTRAVENOUS | Status: DC
  Administered 2020-12-31 – 2021-01-01 (×2): 20 mg via INTRAVENOUS
  Filled 2020-12-31 (×2): qty 50

## 2020-12-31 MED ORDER — PROPOFOL 10 MG/ML IV BOLUS
INTRAVENOUS | Status: DC | PRN
  Administered 2020-12-31: 180 mg via INTRAVENOUS

## 2020-12-31 MED ORDER — DEXAMETHASONE SODIUM PHOSPHATE 4 MG/ML IJ SOLN: 4.0000 mg | INTRAMUSCULAR | Status: DC

## 2020-12-31 MED ORDER — SUGAMMADEX SODIUM 200 MG/2ML IV SOLN
INTRAVENOUS | Status: DC | PRN
  Administered 2020-12-31: 300 mg via INTRAVENOUS

## 2020-12-31 MED ORDER — ACETAMINOPHEN 500 MG PO TABS
1000.0000 mg | ORAL_TABLET | ORAL | Status: AC
  Administered 2020-12-31: 1000 mg via ORAL
  Filled 2020-12-31: qty 2

## 2020-12-31 MED ORDER — SODIUM CHLORIDE 0.9 % IV SOLN
2.0000 g | INTRAVENOUS | Status: AC
  Administered 2020-12-31: 2 g via INTRAVENOUS
  Filled 2020-12-31 (×2): qty 2

## 2020-12-31 MED ORDER — ONDANSETRON HCL 4 MG/2ML IJ SOLN
INTRAMUSCULAR | Status: DC | PRN
  Administered 2020-12-31: 4 mg via INTRAVENOUS

## 2020-12-31 MED ORDER — 0.9 % SODIUM CHLORIDE (POUR BTL) OPTIME
TOPICAL | Status: DC | PRN
  Administered 2020-12-31: 1000 mL

## 2020-12-31 MED ORDER — ONDANSETRON HCL 4 MG/2ML IJ SOLN
INTRAMUSCULAR | Status: AC
  Filled 2020-12-31: qty 2

## 2020-12-31 MED ORDER — ACETAMINOPHEN 500 MG PO TABS
1000.0000 mg | ORAL_TABLET | Freq: Three times a day (TID) | ORAL | Status: DC
  Administered 2020-12-31 – 2021-01-01 (×4): 1000 mg via ORAL
  Filled 2020-12-31 (×4): qty 2

## 2020-12-31 MED ORDER — GABAPENTIN 300 MG PO CAPS
300.0000 mg | ORAL_CAPSULE | ORAL | Status: AC
  Administered 2020-12-31: 300 mg via ORAL
  Filled 2020-12-31: qty 1

## 2020-12-31 MED ORDER — CHLORHEXIDINE GLUCONATE 0.12 % MT SOLN
15.0000 mL | Freq: Once | OROMUCOSAL | Status: AC
  Administered 2020-12-31: 15 mL via OROMUCOSAL

## 2020-12-31 MED ORDER — HYDRALAZINE HCL 10 MG PO TABS
10.0000 mg | ORAL_TABLET | Freq: Three times a day (TID) | ORAL | Status: DC | PRN
  Filled 2020-12-31: qty 1

## 2020-12-31 MED ORDER — BUPIVACAINE LIPOSOME 1.3 % IJ SUSP
INTRAMUSCULAR | Status: DC | PRN
  Administered 2020-12-31: 20 mL

## 2020-12-31 MED ORDER — SIMETHICONE 80 MG PO CHEW
80.0000 mg | CHEWABLE_TABLET | Freq: Four times a day (QID) | ORAL | Status: DC | PRN
  Administered 2020-12-31: 80 mg via ORAL
  Filled 2020-12-31 (×2): qty 1

## 2020-12-31 MED ORDER — FENTANYL CITRATE (PF) 250 MCG/5ML IJ SOLN
INTRAMUSCULAR | Status: DC | PRN
  Administered 2020-12-31: 50 ug via INTRAVENOUS
  Administered 2020-12-31: 100 ug via INTRAVENOUS
  Administered 2020-12-31 (×2): 50 ug via INTRAVENOUS

## 2020-12-31 MED ORDER — MORPHINE SULFATE (PF) 4 MG/ML IV SOLN: 1.0000 mg | INTRAVENOUS | Status: DC | PRN

## 2020-12-31 MED ORDER — PROPOFOL 10 MG/ML IV BOLUS
INTRAVENOUS | Status: AC
  Filled 2020-12-31: qty 20

## 2020-12-31 SURGICAL SUPPLY — 70 items
APL PRP STRL LF DISP 70% ISPRP (MISCELLANEOUS) ×1
APL SKNCLS STERI-STRIP NONHPOA (GAUZE/BANDAGES/DRESSINGS) ×1
APPLIER CLIP ROT 13.4 12 LRG (CLIP) ×3
APR CLP LRG 13.4X12 ROT 20 MLT (CLIP) ×1
BAG COUNTER SPONGE SURGICOUNT (BAG) IMPLANT
BAG LAPAROSCOPIC 12 15 PORT 16 (BASKET) ×1 IMPLANT
BAG RETRIEVAL 12/15 (BASKET) ×2
BAG RETRIEVAL 12/15MM (BASKET) ×1
BAG SPNG CNTER NS LX DISP (BAG)
BAG SURGICOUNT SPONGE COUNTING (BAG)
BENZOIN TINCTURE PRP APPL 2/3 (GAUZE/BANDAGES/DRESSINGS) ×3 IMPLANT
BLADE SURG SZ11 CARB STEEL (BLADE) ×3 IMPLANT
BNDG ADH 1X3 SHEER STRL LF (GAUZE/BANDAGES/DRESSINGS) ×8 IMPLANT
BNDG ADH THN 3X1 STRL LF (GAUZE/BANDAGES/DRESSINGS) ×1
CABLE HIGH FREQUENCY MONO STRZ (ELECTRODE) IMPLANT
CHLORAPREP W/TINT 26 (MISCELLANEOUS) ×3 IMPLANT
CLIP APPLIE ROT 13.4 12 LRG (CLIP) IMPLANT
CLOSURE WOUND 1/2 X4 (GAUZE/BANDAGES/DRESSINGS) ×1
COVER SURGICAL LIGHT HANDLE (MISCELLANEOUS) ×3 IMPLANT
DECANTER SPIKE VIAL GLASS SM (MISCELLANEOUS) ×3 IMPLANT
DRAPE UTILITY XL STRL (DRAPES) ×6 IMPLANT
DRSG TEGADERM 2-3/8X2-3/4 SM (GAUZE/BANDAGES/DRESSINGS) ×2 IMPLANT
ELECT REM PT RETURN 15FT ADLT (MISCELLANEOUS) ×3 IMPLANT
GAUZE 4X4 16PLY ~~LOC~~+RFID DBL (SPONGE) ×3 IMPLANT
GAUZE SPONGE 2X2 8PLY STRL LF (GAUZE/BANDAGES/DRESSINGS) IMPLANT
GLOVE SURG POLYISO LF SZ7 (GLOVE) ×3 IMPLANT
GLOVE SURG UNDER POLY LF SZ7 (GLOVE) ×3 IMPLANT
GOWN STRL REUS W/TWL LRG LVL3 (GOWN DISPOSABLE) ×3 IMPLANT
GOWN STRL REUS W/TWL XL LVL3 (GOWN DISPOSABLE) ×9 IMPLANT
GRASPER SUT TROCAR 14GX15 (MISCELLANEOUS) ×3 IMPLANT
HEMOSTAT SNOW SURGICEL 2X4 (HEMOSTASIS) ×2 IMPLANT
KIT BASIN OR (CUSTOM PROCEDURE TRAY) ×3 IMPLANT
KIT TURNOVER KIT A (KITS) ×3 IMPLANT
MARKER SKIN DUAL TIP RULER LAB (MISCELLANEOUS) ×3 IMPLANT
MAT PREVALON FULL STRYKER (MISCELLANEOUS) IMPLANT
NDL SPNL 22GX3.5 QUINCKE BK (NEEDLE) ×1 IMPLANT
NEEDLE SPNL 22GX3.5 QUINCKE BK (NEEDLE) ×3 IMPLANT
PACK UNIVERSAL I (CUSTOM PROCEDURE TRAY) ×3 IMPLANT
RELOAD STAPLE 60 3.6 BLU REG (STAPLE) IMPLANT
RELOAD STAPLE 60 3.8 GOLD REG (STAPLE) IMPLANT
RELOAD STAPLE 60 4.1 GRN THCK (STAPLE) IMPLANT
RELOAD STAPLER BLUE 60MM (STAPLE) ×3 IMPLANT
RELOAD STAPLER GOLD 60MM (STAPLE) ×1 IMPLANT
RELOAD STAPLER GREEN 60MM (STAPLE) ×1 IMPLANT
SCISSORS LAP 5X45 EPIX DISP (ENDOMECHANICALS) ×2 IMPLANT
SET IRRIG TUBING LAPAROSCOPIC (IRRIGATION / IRRIGATOR) ×3 IMPLANT
SET TUBE SMOKE EVAC HIGH FLOW (TUBING) ×3 IMPLANT
SHEARS HARMONIC ACE PLUS 45CM (MISCELLANEOUS) ×3 IMPLANT
SLEEVE GASTRECTOMY 40FR VISIGI (MISCELLANEOUS) ×3 IMPLANT
SLEEVE XCEL OPT CAN 5 100 (ENDOMECHANICALS) ×6 IMPLANT
SOL ANTI FOG 6CC (MISCELLANEOUS) ×1 IMPLANT
SOLUTION ANTI FOG 6CC (MISCELLANEOUS) ×2
SPONGE GAUZE 2X2 STER 10/PKG (GAUZE/BANDAGES/DRESSINGS) ×2
STAPLER ECHELON LONG 60 440 (INSTRUMENTS) ×3 IMPLANT
STAPLER RELOAD BLUE 60MM (STAPLE) ×9
STAPLER RELOAD GOLD 60MM (STAPLE) ×3
STAPLER RELOAD GREEN 60MM (STAPLE) ×3
STRIP CLOSURE SKIN 1/2X4 (GAUZE/BANDAGES/DRESSINGS) ×2 IMPLANT
SUT ETHIBOND 0 36 GRN (SUTURE) ×4 IMPLANT
SUT MNCRL AB 4-0 PS2 18 (SUTURE) ×3 IMPLANT
SUT VICRYL 0 TIES 12 18 (SUTURE) ×3 IMPLANT
SYR 20ML LL LF (SYRINGE) ×3 IMPLANT
SYR 50ML LL SCALE MARK (SYRINGE) ×3 IMPLANT
TOWEL OR 17X26 10 PK STRL BLUE (TOWEL DISPOSABLE) ×3 IMPLANT
TOWEL OR NON WOVEN STRL DISP B (DISPOSABLE) ×3 IMPLANT
TROCAR BLADELESS 15MM (ENDOMECHANICALS) ×3 IMPLANT
TROCAR BLADELESS OPT 5 100 (ENDOMECHANICALS) ×3 IMPLANT
TUBE CALIBRATION LAPBAND (TUBING) ×2 IMPLANT
TUBING CONNECTING 10 (TUBING) ×4 IMPLANT
TUBING CONNECTING 10' (TUBING) ×2

## 2020-12-31 NOTE — Op Note (Signed)
**Note Crystal-Identified via Obfuscation** Preop Diagnosis: Obesity Class III  Postop Diagnosis: same  Procedure performed: laparoscopic Sleeve Gastrectomy  Assitant: Wenda Low  Indications:  The patient is a 30 y.o. year-old morbidly obese female who has been followed in the Bariatric Clinic as an outpatient. This patient was diagnosed with morbid obesity with a BMI of Body mass index is 43.89 kg/m. The patient was counseled extensively in the Bariatric Outpatient Clinic and after a thorough explanation of the risks and benefits of surgery (including death from complications, bowel leak, infection such as peritonitis and/or sepsis, internal hernia, bleeding, need for blood transfusion, bowel obstruction, organ failure, pulmonary embolus, deep venous thrombosis, wound infection, incisional hernia, skin breakdown, and others entailed on the consent form) and after a compliant diet and exercise program, the patient was scheduled for an elective laparoscopic sleeve gastrectomy.  Description of Operation:  Following informed consent, the patient was taken to the operating room and placed on the operating table in the supine position.  She had previously received prophylactic antibiotics and subcutaneous heparin for DVT prophylaxis in the pre-op holding area.  After induction of general endotracheal anesthesia by the anesthesiologist, the patient underwent placement of sequential compression devices and an oro-gastric tube.  A timeout was confirmed by the surgery and anesthesia teams.  The patient was adequately padded at all pressure points and placed on a footboard to prevent slippage from the OR table during extremes of position during surgery.  She underwent a routine sterile prep and drape of her entire abdomen.    Next, A transverse incision was made under the left subcostal area and a 72mm optical viewing trocar was introduced into the peritoneal cavity. Pneumoperitoneum was applied with a high flow and low pressure. A laparoscope was  inserted to confirm placement. A extraperitoneal block was then placed at the lateral abdominal wall using exparel diluted with marcaine. 5 additional incisions were placed: 1 30mm trocar to the left of the midline. 1 additional 32mm trocar in the left lateral area, 1 26mm trocar in the right mid abdomen, 1 16mm trocar in the right subcostal area, and a Nathanson retractor was placed through a subxiphoid incision.  The hiatus junction looked large. A calibration tube was put down and 10 ml of air put into the balloon and drawn back. It went easily into the chest. Therefore, the pars flaccida was incised with harmonic scalpel. The stomach was reduced but on dissection of the posterior crus there was a visible hernia with small sac. The sac was dissected free and 1 0 ethibond sutures placed in interrupted fashion. There was bleeding on placement of the stitch. A sponge was placed for pressure with hemostatic result. A piece of surgicel snow was placed into the hiatal dissection area.  Next, a hole was created through the lesser omentum along the greater curve of the stomach to enter the lesser sac. The vessels along the greater omentum were  Then ligated and divided using the Harmonic scalpel moving towards the spleen and then short gastric vessels were ligated and divided in the same fashion to fully mobilize the fundus. The left crus was identified to ensure completion of the dissection. Next the antrum was measured and dissection continued inferiorly along the greater curve towards the pylorus and stopped 6cm from the pylorus.   A 40Fr ViSiGi dilator was placed into the esophgaus and along the lesser curve of the stomach and placed on suction. 1 non-reinforced 18mm Green load echelon stapler(s) followed by 1 45mm Gold load echelon stapler(s)  followed by 3 87mm blue load echelon stapler(s) were used to make the resection along the antrum being sure to stay well away from the angularis by angling the jaws of the  stapler towards the greater curve and later completing the resection staying along the ViSiGi and ensuring the fundus was not retained by appropriately retracting it lateral. Air was inserted through the ViSiGi to perform a leak test showing no bubbles and a neutral lie of the stomach.  The assistant then went and performed an upper endoscopy and leak test. No bubbles were seen and the sleeve and antrum distended appropriately. The specimen was then placed in an endocatch bag and removed by the 26mm port. The fascia of the 69mm port was closed with a 0 vicryl by suture passer. The hiatus ws hemostatic. The staple line had 3 areas of bleeding which was treated with clips. Hemostasis was ensured. Pneumoperitoneum was evacuated, all ports were removed and all incisions closed with 4-0 monocryl suture in subcuticular fashion. Steristrips and bandaids were put in place for dressing. The patient awoke from anesthesia and was brought to pacu in stable condition. All counts were correct.  Estimated blood loss: 200 ml  Specimens:  Sleeve gastrectomy  Local Anesthesia: 50 ml Exparel:0.5% Marcaine mix  Post-Op Plan:       Pain Management: PO, prn      Antibiotics: Prophylactic      Anticoagulation: Prophylactic, Starting now      Post Op Studies/Consults: Not applicable      Intended Discharge: within 48h      Intended Outpatient Follow-Up: Two Week      Intended Outpatient Studies: Not Applicable      Other: Not Applicable  Crystal Barton Crystal Barton

## 2020-12-31 NOTE — Op Note (Signed)
Crystal Barton [id number] 04-29-1991 12/31/2020  Preoperative diagnosis: sleeve gastrectomy in progress by Dr. Sheliah Hatch  Postoperative diagnosis: Same   Procedure: Upper endoscopy   Surgeon: Susy Frizzle B. Daphine Barton  M.D., FACS   Anesthesia: Gen.   Indications for procedure: This patient was undergoing a sleeve gastrectomy.    Description of procedure: The endoscopy was placed in the mouth and into the oropharynx and under endoscopic vision it was advanced to the esophagogastric junction.  The pouch was insufflated and passed down the cylinder to a slight angulation and then in to the antrum.  No bleeding or leaks were detected.  The scope was withdrawn without difficulty.     Crystal B. Daphine Deutscher, MD, FACS General, Bariatric, & Minimally Invasive Surgery [facility], [place]

## 2020-12-31 NOTE — H&P (Signed)
Crystal Barton is an 30 y.o. female.   Chief Complaint: obesity HPI: 30 yo female with long history of obesity. She presents for bariatric surgery.  Past Medical History:  Diagnosis Date   Asthma    as a child   Hypertension    STD (sexually transmitted disease)    Chlamydia    Past Surgical History:  Procedure Laterality Date   SHOULDER SURGERY Right 07/2019   ligament repair    History reviewed. No pertinent family history. Social History:  reports that she quit smoking about 9 months ago. Her smoking use included cigarettes. She has never used smokeless tobacco. She reports previous alcohol use. She reports that she does not use drugs.  Allergies: No Known Allergies  Medications Prior to Admission  Medication Sig Dispense Refill   ibuprofen (ADVIL) 800 MG tablet Take 1 tablet (800 mg total) by mouth 3 (three) times daily as needed for cramping. 30 tablet 2    Results for orders placed or performed during the hospital encounter of 12/31/20 (from the past 48 hour(s))  Pregnancy, urine STAT morning of surgery     Status: None   Collection Time: 12/31/20  8:06 AM  Result Value Ref Range   Preg Test, Ur NEGATIVE NEGATIVE    Comment:        THE SENSITIVITY OF THIS METHODOLOGY IS >20 mIU/mL. Performed at [facility], [address], [place], [place] [address]    No results found.  Review of Systems  Constitutional:  Negative for chills and fever.  HENT:  Negative for hearing loss.   Respiratory:  Negative for cough.   Cardiovascular:  Negative for chest pain and palpitations.  Gastrointestinal:  Negative for abdominal pain, nausea and vomiting.  Genitourinary:  Negative for dysuria and urgency.  Musculoskeletal:  Negative for myalgias and neck pain.  Skin:  Negative for rash.  Neurological:  Negative for dizziness and headaches.  Hematological:  Does not bruise/bleed easily.  Psychiatric/Behavioral:  Negative for suicidal ideas.    Blood pressure  128/82, pulse (!) 101, temperature 98.6 F (37 C), temperature source Oral, resp. rate 20, height 5' 7.5" (1.715 m), weight 129 kg, last menstrual period 12/05/2020, SpO2 98 %. Physical Exam Vitals reviewed.  Constitutional:      Appearance: She is well-developed.  HENT:     Head: Normocephalic and atraumatic.  Eyes:     Conjunctiva/sclera: Conjunctivae normal.     Pupils: Pupils are equal, round, and reactive to light.  Cardiovascular:     Rate and Rhythm: Normal rate and regular rhythm.  Pulmonary:     Effort: Pulmonary effort is normal.     Breath sounds: Normal breath sounds.  Abdominal:     General: Bowel sounds are normal. There is no distension.     Palpations: Abdomen is soft.     Tenderness: There is no abdominal tenderness.  Musculoskeletal:        General: Normal range of motion.     Cervical back: Normal range of motion and neck supple.  Skin:    General: Skin is warm and dry.  Neurological:     Mental Status: She is alert and oriented to person, place, and time.  Psychiatric:        Behavior: Behavior normal.    Assessment/Plan 30 yo female with morbid obesity -lap sleeve gastrectomy -ERAS protocol -inpatient admission  Rodman Pickle, MD 12/31/2020, 8:47 AM

## 2020-12-31 NOTE — Anesthesia Postprocedure Evaluation (Signed)
Anesthesia Post Note  Patient: Event organiser  Procedure(s) Performed: LAPAROSCOPIC GASTRIC SLEEVE RESECTION, WITH HIATAL HERNIA REPAIR UPPER GI ENDOSCOPY     Patient location during evaluation: PACU Anesthesia Type: General Level of consciousness: awake and alert Pain management: pain level controlled Vital Signs Assessment: post-procedure vital signs reviewed and stable Respiratory status: spontaneous breathing, nonlabored ventilation, respiratory function stable and patient connected to nasal cannula oxygen Cardiovascular status: blood pressure returned to baseline and stable Postop Assessment: no apparent nausea or vomiting Anesthetic complications: no   No notable events documented.  Last Vitals:  Vitals:   12/31/20 1215 12/31/20 1230  BP: (!) 150/114 (!) 157/105  Pulse: 86 70  Resp: 10 20  Temp:    SpO2: 97% 97%    Last Pain:  Vitals:   12/31/20 1230  TempSrc:   PainSc: Asleep                 Cecile Hearing

## 2020-12-31 NOTE — Transfer of Care (Signed)
Immediate Anesthesia Transfer of Care Note  Patient: Crystal Barton  Procedure(s) Performed: LAPAROSCOPIC GASTRIC SLEEVE RESECTION, WITH HIATAL HERNIA REPAIR UPPER GI ENDOSCOPY  Patient Location: PACU  Anesthesia Type:General  Level of Consciousness: awake, alert  and oriented  Airway & Oxygen Therapy: Patient Spontanous Breathing and Patient connected to face mask oxygen  Post-op Assessment: Report given to RN and Post -op Vital signs reviewed and stable  Post vital signs: Reviewed and stable  Last Vitals:  Vitals Value Taken Time  BP 167/112 12/31/20 1126  Temp    Pulse 90 12/31/20 1127  Resp 13 12/31/20 1128  SpO2 100 % 12/31/20 1127  Vitals shown include unvalidated device data.  Last Pain:  Vitals:   12/31/20 0900  TempSrc:   PainSc: 0-No pain      Patients Stated Pain Goal: 3 (12/31/20 0900)  Complications: No notable events documented.

## 2020-12-31 NOTE — Anesthesia Preprocedure Evaluation (Addendum)
Anesthesia Evaluation  Patient identified by MRN, date of birth, ID band Patient awake    Reviewed: Allergy & Precautions, NPO status , Patient's Chart, lab work & pertinent test results  Airway Mallampati: II  TM Distance: >3 FB Neck ROM: Full    Dental  (+) Teeth Intact, Dental Advisory Given   Pulmonary asthma , former smoker,    Pulmonary exam normal breath sounds clear to auscultation       Cardiovascular hypertension, Normal cardiovascular exam Rhythm:Regular Rate:Normal     Neuro/Psych negative neurological ROS  negative psych ROS   GI/Hepatic negative GI ROS, Neg liver ROS,   Endo/Other  Morbid obesity  Renal/GU negative Renal ROS     Musculoskeletal negative musculoskeletal ROS (+)   Abdominal   Peds  Hematology negative hematology ROS (+)   Anesthesia Other Findings Day of surgery medications reviewed with the patient.  Reproductive/Obstetrics negative OB ROS                            Anesthesia Physical Anesthesia Plan  ASA: 3  Anesthesia Plan: General   Post-op Pain Management:    Induction: Intravenous  PONV Risk Score and Plan: 4 or greater and Scopolamine patch - Pre-op, Midazolam, Dexamethasone and Ondansetron  Airway Management Planned: Oral ETT  Additional Equipment:   Intra-op Plan:   Post-operative Plan: Extubation in OR  Informed Consent: I have reviewed the patients History and Physical, chart, labs and discussed the procedure including the risks, benefits and alternatives for the proposed anesthesia with the patient or authorized representative who has indicated his/her understanding and acceptance.     Dental advisory given  Plan Discussed with: CRNA  Anesthesia Plan Comments:         Anesthesia Quick Evaluation

## 2020-12-31 NOTE — Progress Notes (Signed)
PHARMACY CONSULT FOR:  Risk Assessment for Post-Discharge VTE Following Bariatric Surgery  Post-Discharge VTE Risk Assessment: This patient's probability of 30-day post-discharge VTE is increased due to the factors marked:   Female    Age >/=60 years    BMI >/=50 kg/m2    CHF    Dyspnea at Rest    Paraplegia  X  Non-gastric-band surgery    Operation Time >/=3 hr    Return to OR     Length of Stay >/= 3 d   Hx of VTE   Hypercoagulable condition   Significant venous stasis   Predicted probability of 30-day post-discharge VTE: 0.16% (mild)  Other patient-specific factors to consider: N/A  Recommendation for Discharge: No pharmacologic prophylaxis post-discharge   Crystal Barton is a 30 y.o. female who underwent  laparoscopic gastric sleeve resection on 12/31/20   Case start: 0956 Case end: 1117   No Known Allergies  Patient Measurements: Height: 5' 7.5" (171.5 cm) Weight: 129 kg (284 lb 6.4 oz) IBW/kg (Calculated) : 62.75 Body mass index is 43.89 kg/m.  No results for input(s): WBC, HGB, HCT, PLT, APTT, CREATININE, LABCREA, CREATININE, CREAT24HRUR, MG, PHOS, ALBUMIN, PROT, ALBUMIN, AST, ALT, ALKPHOS, BILITOT, BILIDIR, IBILI in the last 72 hours. Estimated Creatinine Clearance: 145 mL/min (by C-G formula based on SCr of 0.67 mg/dL).    Past Medical History:  Diagnosis Date   Asthma    as a child   Hypertension    STD (sexually transmitted disease)    Chlamydia     Medications Prior to Admission  Medication Sig Dispense Refill Last Dose   ibuprofen (ADVIL) 800 MG tablet Take 1 tablet (800 mg total) by mouth 3 (three) times daily as needed for cramping. 30 tablet 2 12/17/2020    Rexford Maus, PharmD 12/31/2020 12:27 PM

## 2020-12-31 NOTE — Anesthesia Procedure Notes (Signed)
Procedure Name: Intubation Date/Time: 12/31/2020 9:37 AM Performed by: Maxwell Caul, CRNA Pre-anesthesia Checklist: Patient identified, Emergency Drugs available, Suction available and Patient being monitored Patient Re-evaluated:Patient Re-evaluated prior to induction Oxygen Delivery Method: Circle system utilized Preoxygenation: Pre-oxygenation with 100% oxygen Induction Type: IV induction Ventilation: Mask ventilation without difficulty Laryngoscope Size: Mac and 4 Grade View: Grade I Tube type: Oral Tube size: 7.5 mm Number of attempts: 1 Airway Equipment and Method: Stylet Placement Confirmation: ETT inserted through vocal cords under direct vision, positive ETCO2 and breath sounds checked- equal and bilateral Secured at: 21 cm Tube secured with: Tape Dental Injury: Teeth and Oropharynx as per pre-operative assessment

## 2020-12-31 NOTE — Progress Notes (Signed)

## 2021-01-01 ENCOUNTER — Encounter (HOSPITAL_COMMUNITY): Payer: Self-pay | Admitting: General Surgery

## 2021-01-01 ENCOUNTER — Other Ambulatory Visit (HOSPITAL_COMMUNITY): Payer: Self-pay

## 2021-01-01 LAB — CBC WITH DIFFERENTIAL/PLATELET
Abs Immature Granulocytes: 0.05 10*3/uL (ref 0.00–0.07)
Basophils Absolute: 0 10*3/uL (ref 0.0–0.1)
Basophils Relative: 0 %
Eosinophils Absolute: 0 10*3/uL (ref 0.0–0.5)
Eosinophils Relative: 0 %
HCT: 35.6 % — ABNORMAL LOW (ref 36.0–46.0)
Hemoglobin: 11.7 g/dL — ABNORMAL LOW (ref 12.0–15.0)
Immature Granulocytes: 0 %
Lymphocytes Relative: 13 %
Lymphs Abs: 1.5 10*3/uL (ref 0.7–4.0)
MCH: 28.5 pg (ref 26.0–34.0)
MCHC: 32.9 g/dL (ref 30.0–36.0)
MCV: 86.6 fL (ref 80.0–100.0)
Monocytes Absolute: 1 10*3/uL (ref 0.1–1.0)
Monocytes Relative: 9 %
Neutro Abs: 9.3 10*3/uL — ABNORMAL HIGH (ref 1.7–7.7)
Neutrophils Relative %: 78 %
Platelets: 310 10*3/uL (ref 150–400)
RBC: 4.11 MIL/uL (ref 3.87–5.11)
RDW: 12.9 % (ref 11.5–15.5)
WBC: 11.9 10*3/uL — ABNORMAL HIGH (ref 4.0–10.5)
nRBC: 0 % (ref 0.0–0.2)

## 2021-01-01 LAB — COMPREHENSIVE METABOLIC PANEL
ALT: 29 U/L (ref 0–44)
AST: 27 U/L (ref 15–41)
Albumin: 3.6 g/dL (ref 3.5–5.0)
Alkaline Phosphatase: 46 U/L (ref 38–126)
Anion gap: 6 (ref 5–15)
BUN: 9 mg/dL (ref 6–20)
CO2: 26 mmol/L (ref 22–32)
Calcium: 8.9 mg/dL (ref 8.9–10.3)
Chloride: 105 mmol/L (ref 98–111)
Creatinine, Ser: 0.62 mg/dL (ref 0.44–1.00)
GFR, Estimated: >60 mL/min (ref >60)
Glucose, Bld: 119 mg/dL — ABNORMAL HIGH (ref 70–99)
Potassium: 3.9 mmol/L (ref 3.5–5.1)
Sodium: 137 mmol/L (ref 135–145)
Total Bilirubin: 0.6 mg/dL (ref 0.3–1.2)
Total Protein: 7.2 g/dL (ref 6.5–8.1)

## 2021-01-01 LAB — SURGICAL PATHOLOGY

## 2021-01-01 MED ORDER — OXYCODONE HCL 5 MG PO TABS
5.0000 mg | ORAL_TABLET | Freq: Four times a day (QID) | ORAL | 0 refills | Status: DC | PRN
  Filled 2021-01-01: qty 10, 3d supply, fill #0

## 2021-01-01 MED ORDER — ACETAMINOPHEN 500 MG PO TABS: 1000.0000 mg | ORAL_TABLET | Freq: Three times a day (TID) | ORAL | 0 refills | Status: AC

## 2021-01-01 MED ORDER — ONDANSETRON 4 MG PO TBDP
4.0000 mg | ORAL_TABLET | Freq: Four times a day (QID) | ORAL | 0 refills | Status: DC | PRN
  Filled 2021-01-01: qty 20, 5d supply, fill #0

## 2021-01-01 MED ORDER — PANTOPRAZOLE SODIUM 40 MG PO TBEC
40.0000 mg | DELAYED_RELEASE_TABLET | Freq: Every day | ORAL | 0 refills | Status: DC
  Filled 2021-01-01: qty 40, 40d supply, fill #0

## 2021-01-01 NOTE — Progress Notes (Signed)
Patient alert and oriented, pain is controlled. Patient is tolerating fluids, advanced to protein shake today, patient is tolerating well.  Reviewed Gastric sleeve discharge instructions with patient and patient is able to articulate understanding.  Provided information on BELT program, Support Group and [facility] outpatient pharmacy. All questions answered, will continue to monitor.  

## 2021-01-01 NOTE — Progress Notes (Signed)
   Progress Note: Metabolic and Bariatric Surgery Service   Chief Complaint/Subjective: Tolerating liquids, ambulating, some soreness over right side  Objective: Vital signs in last 24 hours: Temp:  [97.6 F (36.4 C)-99.1 F (37.3 C)] 98.6 F (37 C) (07/19 0535) Pulse Rate:  [64-89] 82 (07/19 0535) Resp:  [10-20] 18 (07/19 0535) BP: (101-161)/(60-114) 139/97 (07/19 0535) SpO2:  [95 %-100 %] 96 % (07/19 0535) Last BM Date: 12/31/20  Intake/Output from previous day: 07/18 0701 - 07/19 0700 In: 3095 [P.O.:420; I.V.:2575; IV Piggyback:100] Out: 2450 [Urine:2350; Blood:100] Intake/Output this shift: No intake/output data recorded.  Lungs: nonlabored  Cardiovascular: RRR  Abd: soft, ATTP, incision c/d/i  Extremities: no edema  Neuro: AOx4  Lab Results: CBC  Recent Labs    12/31/20 1156 01/01/21 0401  WBC  --  11.9*  HGB 12.4 11.7*  HCT 39.4 35.6*  PLT  --  310   BMET Recent Labs    01/01/21 0401  NA 137  K 3.9  CL 105  CO2 26  GLUCOSE 119*  BUN 9  CREATININE 0.62  CALCIUM 8.9   PT/INR No results for input(s): LABPROT, INR in the last 72 hours. ABG No results for input(s): PHART, HCO3 in the last 72 hours.  Invalid input(s): PCO2, PO2  Studies/Results:  Anti-infectives: Anti-infectives (From admission, onward)    Start     Dose/Rate Route Frequency Ordered Stop   12/31/20 0815  cefoTEtan (CEFOTAN) 2 g in sodium chloride 0.9 % 100 mL IVPB        2 g 200 mL/hr over 30 Minutes Intravenous On call to O.R. 12/31/20 0805 12/31/20 1010       Medications: Scheduled Meds:  acetaminophen  1,000 mg Oral Q8H   Or   acetaminophen (TYLENOL) oral liquid 160 mg/5 mL  1,000 mg Oral Q8H   enoxaparin (LOVENOX) injection  30 mg Subcutaneous Q12H   Ensure Max Protein  2 oz Oral Q2H   Continuous Infusions:  dextrose 5 % and 0.45% NaCl 1,000 mL (12/31/20 1341)   famotidine (PEPCID) IV 20 mg (01/01/21 0933)   PRN Meds:.hydrALAZINE, morphine injection,  ondansetron (ZOFRAN) IV, oxyCODONE, simethicone  Assessment/Plan: Patient Active Problem List   Diagnosis Date Noted   Morbid obesity (HCC) 12/31/2020   Hidradenitis axillaris 09/14/2018   Dysmenorrhea 01/02/2016   Obesity 11/02/2013   s/p Procedure(s): LAPAROSCOPIC GASTRIC SLEEVE RESECTION, WITH HIATAL HERNIA REPAIR UPPER GI ENDOSCOPY 12/31/2020 -tolerating diet -continue protocl -possible discharge today  Disposition:  LOS: 1 day  The patient will be in the hospital for normal postop protocol  Rodman Pickle, MD [phone] [facility]

## 2021-01-01 NOTE — Progress Notes (Signed)
Reviewed written d/c instructions w pt and all questions answered. Pt verbalized understanding. D/C per w/c w all belongings in stable condition. 

## 2021-01-01 NOTE — Discharge Instructions (Signed)

## 2021-01-01 NOTE — Progress Notes (Signed)
Nutrition Education Note ° °Received consult for diet education for patient s/p bariatric surgery. ° °Discussed 2 week post op diet with pt. Emphasized that liquids must be non carbonated, non caffeinated, and sugar free. Fluid goals discussed. Pt to follow up with outpatient bariatric RD for further diet progression after 2 weeks. Multivitamins and minerals also reviewed. Teach back method used, pt expressed understanding, expect good compliance. ° °If nutrition issues arise, please consult RD. ° °Herbert Aguinaldo, MS, RD, LDN °Inpatient Clinical Dietitian °Contact information available via Amion ° ° °

## 2021-01-01 NOTE — Progress Notes (Signed)
24hr fluid recall: .  Per dehydration protocol, will call pt to f/u within one week post op.

## 2021-01-01 NOTE — Progress Notes (Signed)
Pt started protein at 0520

## 2021-01-02 ENCOUNTER — Other Ambulatory Visit (HOSPITAL_COMMUNITY): Payer: Self-pay

## 2021-01-04 ENCOUNTER — Telehealth (HOSPITAL_COMMUNITY): Payer: Self-pay | Admitting: *Deleted

## 2021-01-04 NOTE — Discharge Summary (Signed)
Physician Discharge Summary  Crystal Barton WUX:[id number] DOB: 06-28-1990 DOA: 12/31/2020  PCP: Gastroenterology, [facility]  Admit date: 12/31/2020 Discharge date: 01/04/2021  Recommendations for Outpatient Follow-up:   (include homehealth, outpatient follow-up instructions, specific recommendations for PCP to follow-up on, etc.)   Follow-up Information     Glora Hulgan, [place], MD. Go on 01/25/2021.   Specialty: General Surgery Why: at 3:20pm. Please arrive 15 minutes prior to your appointment time.  Thank you. Contact information: [address] [place] [address] [phone]         [facility], [place]. Go on 02/13/2021.   Specialty: General Surgery Why: at 9am with Dr. Sheliah Hatch.  Please arrive 15 minutes prior to your appointment time.  Thank you. Contact information: [address] [place] [address] [phone]                Discharge Diagnoses:  Active Problems:   Morbid obesity (HCC)   Surgical Procedure: laparoscopic sleeve gastrectomy, upper endoscopy  Discharge Condition: Good Disposition: Home  Diet recommendation: Postoperative sleeve gastrectomy diet (liquids only)  Filed Weights   12/31/20 0820  Weight: 129 kg     Hospital Course:  The patient was admitted after undergoing laparoscopic sleeve gastrectomy. POD 0 she ambulated well. POD 1 she was started on the water diet protocol and tolerated 200 ml in the first shift. Once meeting the water amount she was advanced to bariatric protein shakes which they tolerated and were discharged home POD 2.  Treatments: surgery: laparoscopic sleeve gastrectomy  Discharge Instructions  Discharge Instructions     Ambulate hourly while awake   Complete by: As directed    Call MD for:  difficulty breathing, headache or visual disturbances   Complete by: As directed    Call MD for:  persistant dizziness or light-headedness   Complete by: As directed    Call MD for:   persistant nausea and vomiting   Complete by: As directed    Call MD for:  redness, tenderness, or signs of infection (pain, swelling, redness, odor or green/yellow discharge around incision site)   Complete by: As directed    Call MD for:  severe uncontrolled pain   Complete by: As directed    Call MD for:  temperature >101 F   Complete by: As directed    Diet bariatric full liquid   Complete by: As directed    Discharge wound care:   Complete by: As directed    Remove Bandaids tomorrow, ok to shower tomorrow. Steristrips may fall off in 1-3 weeks.   Incentive spirometry   Complete by: As directed    Perform hourly while awake      Allergies as of 01/01/2021   No Known Allergies      Medication List     STOP taking these medications    ibuprofen 800 MG tablet Commonly known as: ADVIL       TAKE these medications    acetaminophen 500 MG tablet Commonly known as: TYLENOL Take 2 tablets (1,000 mg total) by mouth every 8 (eight) hours for 5 days.   ondansetron 4 MG disintegrating tablet Commonly known as: ZOFRAN-ODT Dissolve 1 tablet (4 mg total) by mouth every 6 (six) hours as needed for nausea or vomiting.   oxyCODONE 5 MG immediate release tablet Commonly known as: Oxy IR/ROXICODONE Take 1 tablet (5 mg total) by mouth every 6 (six) hours as needed for severe pain.   pantoprazole 40 MG tablet  Commonly known as: PROTONIX Take 1 tablet (40 mg total) by mouth daily.               Discharge Care Instructions  (From admission, onward)           Start     Ordered   01/01/21 0000  Discharge wound care:       Comments: Remove Bandaids tomorrow, ok to shower tomorrow. Steristrips may fall off in 1-3 weeks.   01/01/21 1015            Follow-up Information     Crystal Barton, De Blanch, MD. Go on 01/25/2021.   Specialty: General Surgery Why: at 3:20pm. Please arrive 15 minutes prior to your appointment time.  Thank you. Contact information: [address] [place] [address] [phone]         Sur[facility], [place]. Go on 02/13/2021.   Specialty: General Surgery Why: at 9am with Dr. Sheliah Hatch.  Please arrive 15 minutes prior to your appointment time.  Thank you. Contact information: [address] [employer] [place] [address] [phone]                  The results of significant diagnostics from this hospitalization (including imaging, microbiology, ancillary and laboratory) are listed below for reference.    Significant Diagnostic Studies: No results found.  Labs: Basic Metabolic Panel: Recent Labs  Lab 01/01/21 0401  NA 137  K 3.9  CL 105  CO2 26  GLUCOSE 119*  BUN 9  CREATININE 0.62  CALCIUM 8.9   Liver Function Tests: Recent Labs  Lab 01/01/21 0401  AST 27  ALT 29  ALKPHOS 46  BILITOT 0.6  PROT 7.2  ALBUMIN 3.6    CBC: Recent Labs  Lab 12/31/20 1156 01/01/21 0401  WBC  --  11.9*  NEUTROABS  --  9.3*  HGB 12.4 11.7*  HCT 39.4 35.6*  MCV  --  86.6  PLT  --  310    CBG: No results for input(s): GLUCAP in the last 168 hours.  Active Problems:   Morbid obesity (HCC)   VTE plan: no chemical prophylaxis recommended ([email or web address])  Time coordinating discharge: 15 mn

## 2021-01-04 NOTE — Telephone Encounter (Signed)
1.  Tell me about your pain and pain management? Pt denies any current pain. States that she is experiencing some incisional pain in the one "beside her belly button", but it only hurts when she moves.  States that tylenol alleviates the discomfort.  2.  Let's talk about fluid intake.  How much total fluid are you taking in? Pt states that she is getting in at least 64oz of fluid including protein shakes, bottled water, Gatorade Zero, and cream soups.  3.  How much protein have you taken in the last 2 days? Pt states she is meeting her goal of 60g of protein each day with the protein shakes.  4.  Have you had nausea?  Tell me about when have experienced nausea and what you did to help? Pt denies nausea.   5.  Has the frequency or color changed with your urine? Pt states that she is urinating "fine" with no changes in frequency or urgency.     6.  Tell me what your incisions look like? "Incisions look fine". Pt denies a fever, chills.  Pt states incisions are not swollen, open, or draining.  Pt encouraged to call [facility] if incisions change.   7.  Have you been passing gas? BM? Pt states that she has not had a BM.  Pt instructed to take either Miralax or MoM as instructed per "Gastric Bypass/Sleeve Discharge Home Care Instructions".  Pt to call surgeon's office if not able to have BM with medication.   8.  If a problem or question were to arise who would you call?  Do you know contact numbers for [facility], [facility], and [facility]? Pt denies dehydration symptoms.  Pt can describe s/sx of dehydration.  Pt knows to call [facility] for surgical, [facility] for nutrition, and [facility] for non-urgent questions or concerns.   9.  How has the walking going? Pt states she is walking around and able to be active without difficulty. Pt states that she has walked around her neighborhood already.   10. Are you still using your incentive spirometer?  If so, how often? Pt states that she is still experiencing some "chest tightness or gas  pains".  Pt denies CP, SOB at rest or on exertion.  Pt states that she is doing her I.S. some and is meeting goal of 1250.  Pt instructed to use incentive spirometer, at least 10x every hour while awake until she sees the surgeon and to adjust goal as she exceeds 1250.  Pt instructed to call office if symptoms worsen or do not improve.  11.  How are your vitamins and calcium going?  How are you taking them? Pt states that she is taking her supplements and vitamins without difficulty.  Reminded patient that the first 30 days post-operatively are important for successful recovery.  Practice good hand hygiene, wearing a mask when appropriate (since optional in most places), and minimizing exposure to people who live outside of the home, especially if they are exhibiting any respiratory, GI, or illness-like symptoms.

## 2021-01-07 ENCOUNTER — Telehealth (HOSPITAL_COMMUNITY): Payer: Self-pay | Admitting: *Deleted

## 2021-01-07 NOTE — Telephone Encounter (Signed)
Pt called with c/o right lower abdomen incisional pain.  Pt states that it still "hurts and burns" when standing up and sitting down.  Pt instructed to splint pain when changing positions to help and alleviate discomfort.  Pt also instructed to take pain medication if needed (pt doesn't prefer to take because it makes her drowsy).  Pt instructed to call surgeon if pain persists or worsens. Will f/u as needed.

## 2021-01-15 ENCOUNTER — Encounter: Payer: PRIVATE HEALTH INSURANCE | Attending: General Surgery | Admitting: Skilled Nursing Facility1

## 2021-01-15 ENCOUNTER — Other Ambulatory Visit: Payer: Self-pay

## 2021-01-15 DIAGNOSIS — E669 Obesity, unspecified: Secondary | ICD-10-CM | POA: Insufficient documentation

## 2021-01-15 NOTE — Progress Notes (Signed)
2 Week Post-Operative Nutrition Class   Patient was seen on 01/15/2021 for Post-Operative Nutrition education at the [facility].    Surgery date: 12/31/2020 Surgery type: sleeve Start weight at [facility]: 284.5 pounds Weight today: 260.2 pounds Bowel Habits: Every day to every other day no complaints   Body Composition Scale 01/15/2021  Current Body Weight 260.2  Total Body Fat % 44.1  Visceral Fat 11  Fat-Free Mass % 55.8   Total Body Water % 42.4  Muscle-Mass lbs 34.6  BMI 40  Body Fat Displacement          Torso  lbs 71.2         Left Leg  lbs 14.2         Right Leg  lbs 14.2         Left Arm  lbs 7.1         Right Arm   lbs 7.1      The following the learning objectives were met by the patient during this course: Identifies Phase 3 (Soft, High Proteins) Dietary Goals and will begin from 2 weeks post-operatively to 2 months post-operatively Identifies appropriate sources of fluids and proteins  Identifies appropriate fat sources and healthy verses unhealthy fat types   States protein recommendations and appropriate sources post-operatively Identifies the need for appropriate texture modifications, mastication, and bite sizes when consuming solids Identifies appropriate fat consumption and sources Identifies appropriate multivitamin and calcium sources post-operatively Describes the need for physical activity post-operatively and will follow MD recommendations States when to call healthcare provider regarding medication questions or post-operative complications   Handouts given during class include: Phase 3A: Soft, High Protein Diet Handout Phase 3 High Protein Meals Healthy Fats   Follow-Up Plan: Patient will follow-up at [facility] in 6 weeks for 2 month post-op nutrition visit for diet advancement per MD.

## 2021-01-21 ENCOUNTER — Telehealth: Payer: Self-pay | Admitting: Skilled Nursing Facility1

## 2021-01-21 NOTE — Telephone Encounter (Signed)
RD called pt to verify fluid intake once starting soft, solid proteins 2 week post-bariatric surgery.   Daily Fluid intake: 64 oz Daily Protein intake: 60 g Bowel Habits: every day to every other day  Concerns/issues:   None stated 

## 2021-02-27 ENCOUNTER — Encounter: Payer: PRIVATE HEALTH INSURANCE | Attending: General Surgery | Admitting: Skilled Nursing Facility1

## 2021-02-27 ENCOUNTER — Other Ambulatory Visit: Payer: Self-pay

## 2021-02-27 DIAGNOSIS — F17210 Nicotine dependence, cigarettes, uncomplicated: Secondary | ICD-10-CM | POA: Insufficient documentation

## 2021-02-27 DIAGNOSIS — E669 Obesity, unspecified: Secondary | ICD-10-CM | POA: Diagnosis present

## 2021-02-27 DIAGNOSIS — Z6841 Body Mass Index (BMI) 40.0 and over, adult: Secondary | ICD-10-CM | POA: Insufficient documentation

## 2021-02-27 NOTE — Progress Notes (Signed)
Bariatric Nutrition Follow-Up Visit Medical Nutrition Therapy    NUTRITION ASSESSMENT    Surgery date: 12/31/2020 Surgery type: sleeve Start weight at [facility]: 284.5 pounds Weight today: 244.6 pounds   Body Composition Scale 01/15/2021 02/27/2021  Current Body Weight 260.2 244.6  Total Body Fat % 44.1 41.4  Visceral Fat 11 10  Fat-Free Mass % 55.8 58.5   Total Body Water % 42.4 43.7  Muscle-Mass lbs 34.6 35.7  BMI 40 37.6  Body Fat Displacement           Torso  lbs 71.2 62.8         Left Leg  lbs 14.2 12.5         Right Leg  lbs 14.2 12.5         Left Arm  lbs 7.1 6.2         Right Arm   lbs 7.1 6.2   Clinical  Medical hx:  Medications: see list  Labs:    Lifestyle & Dietary Hx  Pt states she has been having some back pains and wanting to start on ibprofen again.    Estimated daily fluid intake: 64+ oz Estimated daily protein intake: 80+ g Supplements: multi and calcium  Current average weekly physical activity: treadmill or stair stepper walking 3 days a week and 2 days a week with her aunt     24-Hr Dietary Recall First Meal: protein shake Snack:   Second Meal: meat and cheese roll up  Snack:   Third Meal: protein shake or chicken salad Snack:  Beverages: water, diet green tea, gatorade zero  Post-Op Goals/ Signs/ Symptoms Using straws: no Drinking while eating: no Chewing/swallowing difficulties: no Changes in vision: no Changes to mood/headaches: no Hair loss/changes to skin/nails: no Difficulty focusing/concentrating: no Sweating: no Dizziness/lightheadedness: non Palpitations: on  Carbonated/caffeinated beverages: no N/V/D/C/Gas: every 3-4 days a bowel movement  Abdominal pain: no Dumping syndrome: no    NUTRITION DIAGNOSIS  Overweight/obesity (-3.3) related to past poor dietary habits and physical inactivity as evidenced by completed bariatric surgery and following dietary guidelines for continued weight loss and healthy nutrition status.      NUTRITION INTERVENTION Nutrition counseling (C-1) and education (E-2) to facilitate bariatric surgery goals, including: Diet advancement to the next phase (phase 4) now including non starchy vegetables  The importance of consuming adequate calories as well as certain nutrients daily due to the body's need for essential vitamins, minerals, and fats The importance of daily physical activity and to reach a goal of at least 150 minutes of moderate to vigorous physical activity weekly (or as directed by their physician) due to benefits such as increased musculature and improved lab values The importance of intuitive eating specifically learning hunger-satiety cues and understanding the importance of learning a new body: The importance of mindful eating to avoid grazing behaviors  Goals: -Continue to aim for a minimum of 64 fluid ounces 7 days a week with at least 30 ounces being plain water  -Eat non-starchy vegetables 2 times a day 7 days a week  -Start out with soft cooked vegetables today and tomorrow; if tolerated begin to eat raw vegetables or cooked including salads  -Eat your 3 ounces of protein first then start in on your non-starchy vegetables; once you understand how much of your meal leads to satisfaction and not full while still eating 3 ounces of protein and non-starchy vegetables you can eat them in any order   -Continue to aim for 30 minutes of activity  at least 5 times a week  -Do NOT cook with/add to your food: alfredo sauce, cheese sauce, barbeque sauce, ketchup, fat back, butter, bacon grease, grease, Crisco, OR SUGAR   Handouts Provided Include  Phase 4  Learning Style & Readiness for Change Teaching method utilized: Visual & Auditory  Demonstrated degree of understanding via: Teach Back  Readiness Level: Action Barriers to learning/adherence to lifestyle change: communication   RD's Notes for Next Visit Assess adherence to pt chosen goals    MONITORING &  EVALUATION Dietary intake, weekly physical activity, body weight  Next Steps Patient is to follow-up in 3 months

## 2021-03-12 ENCOUNTER — Ambulatory Visit: Payer: Self-pay

## 2021-03-14 ENCOUNTER — Encounter: Payer: Self-pay | Admitting: Family Medicine

## 2021-03-14 ENCOUNTER — Ambulatory Visit: Payer: PRIVATE HEALTH INSURANCE | Admitting: Family Medicine

## 2021-03-14 VITALS — BP 122/86 | HR 82 | Ht 67.5 in | Wt 238.0 lb

## 2021-03-14 DIAGNOSIS — Z789 Other specified health status: Secondary | ICD-10-CM

## 2021-03-14 DIAGNOSIS — M545 Low back pain, unspecified: Secondary | ICD-10-CM

## 2021-03-14 NOTE — Progress Notes (Signed)
Subjective:     Patient ID: Crystal Barton, female   DOB: 05-21-1991, 30 y.o.   MRN: [id number]  HPI Dakiya presents to the [facility] and wellness clinic today for her required wellness visit for insurance as well as eval of lower back pain. She sees Theatre stage manager for PCP. Pap smear is UTD. 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 Has been having mid-low back pain constant over the last month. Denies any injury. She reports taking tylenol with some relief. Denies any numbness or tingling or sciatic pain.   Past Medical History:  Diagnosis Date   Asthma    as a child   Hypertension    STD (sexually transmitted disease)    Chlamydia   No Known Allergies  Current Outpatient Medications:    acetaminophen (TYLENOL) 325 MG tablet, Take 650 mg by mouth every 6 (six) hours as needed., Disp: , Rfl:    ibuprofen (ADVIL) 200 MG tablet, Take 200 mg by mouth every 6 (six) hours as needed., Disp: , Rfl:     Review of Systems  Constitutional:  Negative for chills, fatigue, fever and unexpected weight change.  HENT:  Negative for congestion, ear pain, sinus pressure, sinus pain and sore throat.   Eyes:  Negative for discharge and visual disturbance.  Respiratory:  Negative for cough, shortness of breath and wheezing.   Cardiovascular:  Negative for chest pain and leg swelling.  Gastrointestinal:  Negative for abdominal pain, blood in stool, constipation, diarrhea, nausea and vomiting.  Genitourinary:  Negative for difficulty urinating and hematuria.  Musculoskeletal:  Positive for back pain.  Skin:  Negative for color change.  Neurological:  Negative for dizziness, weakness, light-headedness and headaches.  Hematological:  Negative for adenopathy.  All other systems reviewed and are negative.     Objective:   Physical Exam Vitals reviewed.  Constitutional:      General: She is not in acute distress.    Appearance: Normal appearance. She  is well-developed.  HENT:     Head: Normocephalic and atraumatic.  Eyes:     General:        Right eye: No discharge.        Left eye: No discharge.  Cardiovascular:     Rate and Rhythm: Normal rate and regular rhythm.     Heart sounds: Normal heart sounds.  Pulmonary:     Effort: Pulmonary effort is normal. No respiratory distress.     Breath sounds: Normal breath sounds.  Musculoskeletal:     Cervical back: Normal, normal range of motion and neck supple. No bony tenderness. No pain with movement.     Lumbar back: Tenderness present. No edema, deformity or signs of trauma. Normal range of motion. Negative right straight leg raise test and negative left straight leg raise test.  Skin:    General: Skin is warm and dry.  Neurological:     Mental Status: She is alert and oriented to person, place, and time.  Psychiatric:        Mood and Affect: Mood normal.        Behavior: Behavior normal.  Today's Vitals   03/14/21 1437  BP: 122/86  Pulse: 82  SpO2: 99%  Weight: 238 lb (108 kg)  Height: 5' 7.5" (1.715 m)   Body mass index is 36.73 kg/m.      Assessment:     Participant in health and wellness plan  Acute midline low back pain without  sciatica     Plan:     Keep all regular appts with PCP. Encouraged continued efforts at healthy eating and exercise.  No red flags with acute back pain. Recommend treating with anti-inflammatory and some back exercises/stretches. Handout given. If no improvement over the next couple weeks recommend f/u with PCP for further eval.

## 2021-03-14 NOTE — Patient Instructions (Signed)

## 2021-05-08 ENCOUNTER — Encounter: Payer: PRIVATE HEALTH INSURANCE | Attending: General Surgery | Admitting: Skilled Nursing Facility1

## 2021-05-08 ENCOUNTER — Other Ambulatory Visit: Payer: Self-pay

## 2021-05-08 DIAGNOSIS — E669 Obesity, unspecified: Secondary | ICD-10-CM | POA: Insufficient documentation

## 2021-05-08 NOTE — Progress Notes (Signed)
Bariatric Nutrition Follow-Up Visit Medical Nutrition Therapy    NUTRITION ASSESSMENT    Surgery date: 12/31/2020 Surgery type: sleeve Start weight at [facility]: 284.5 pounds Weight today: 216.3 pounds   Body Composition Scale 01/15/2021 02/27/2021 05/08/2021  Current Body Weight 260.2 244.6 216.3  Total Body Fat % 44.1 41.4 37.8  Visceral Fat 11 10 8   Fat-Free Mass % 55.8 58.5 62.1   Total Body Water % 42.4 43.7 45.5  Muscle-Mass lbs 34.6 35.7 35.5  BMI 40 37.6 33.2  Body Fat Displacement            Torso  lbs 71.2 62.8 50.7         Left Leg  lbs 14.2 12.5 10.1         Right Leg  lbs 14.2 12.5 10.1         Left Arm  lbs 7.1 6.2 5.0         Right Arm   lbs 7.1 6.2 5.0   Clinical  Medical hx:  Medications: see list  Labs:    Lifestyle & Dietary Hx  Pt states her back is better.  Pt states she has a bowel movement about every other day without strain.  Pt states she eats brown rice sometimes.  Pt states she will eat fried chicken and pick off the skin.  Pt states sweets do not agree with her stomach.   Estimated daily fluid intake: 64+ oz Estimated daily protein intake: 80+ g Supplements: multi and calcium  Current average weekly physical activity: treadmill or stair stepper walking 3 days a week with light weights working out with her aunt to keep her motivated   24-Hr Dietary Recall First Meal: protein shake or egg + sausage or yogurt Snack:   Second Meal: salad: romaine + chicken + egg + tomato + cucumber or tuna or chicken Snack:   Third Meal: chicken or steak + brussels sprout or green beans Snack:  Beverages: water, diet green tea, gatorade zero  Post-Op Goals/ Signs/ Symptoms Using straws: no Drinking while eating: no Chewing/swallowing difficulties: no Changes in vision: no Changes to mood/headaches: no Hair loss/changes to skin/nails: no Difficulty focusing/concentrating: no Sweating: no Dizziness/lightheadedness: non Palpitations: on   Carbonated/caffeinated beverages: no N/V/D/C/Gas: no Abdominal pain: no Dumping syndrome: no    NUTRITION DIAGNOSIS  Overweight/obesity ([place]-3.3) related to past poor dietary habits and physical inactivity as evidenced by completed bariatric surgery and following dietary guidelines for continued weight loss and healthy nutrition status.     NUTRITION INTERVENTION Nutrition counseling (C-1) and education (E-2) to facilitate bariatric surgery goals, including: Diet advancement to the next phase (phase 5) now including starchy vegetables  The importance of consuming adequate calories as well as certain nutrients daily due to the body's need for essential vitamins, minerals, and fats The importance of daily physical activity and to reach a goal of at least 150 minutes of moderate to vigorous physical activity weekly (or as directed by their physician) due to benefits such as increased musculature and improved lab values The importance of intuitive eating specifically learning hunger-satiety cues and understanding the importance of learning a new body: The importance of mindful eating to avoid grazing behaviors    Handouts Provided Include  Phase 5  Learning Style & Readiness for Change Teaching method utilized: Visual & Auditory  Demonstrated degree of understanding via: Teach Back  Readiness Level: Action Barriers to learning/adherence to lifestyle change: communication   RD's Notes for Next Visit Assess adherence to pt  chosen goals    MONITORING & EVALUATION Dietary intake, weekly physical activity, body weight  Next Steps Patient is to follow-up in 3 months

## 2021-07-28 ENCOUNTER — Emergency Department (HOSPITAL_BASED_OUTPATIENT_CLINIC_OR_DEPARTMENT_OTHER)
Admission: EM | Admit: 2021-07-28 | Discharge: 2021-07-28 | Disposition: A | Discharge status: Home / Self Care | Payer: PRIVATE HEALTH INSURANCE | Attending: Emergency Medicine | Admitting: Emergency Medicine

## 2021-07-28 ENCOUNTER — Other Ambulatory Visit: Payer: Self-pay

## 2021-07-28 ENCOUNTER — Encounter (HOSPITAL_BASED_OUTPATIENT_CLINIC_OR_DEPARTMENT_OTHER): Payer: Self-pay | Admitting: Emergency Medicine

## 2021-07-28 DIAGNOSIS — H1011 Acute atopic conjunctivitis, right eye: Secondary | ICD-10-CM | POA: Diagnosis not present

## 2021-07-28 DIAGNOSIS — R220 Localized swelling, mass and lump, head: Secondary | ICD-10-CM | POA: Diagnosis present

## 2021-07-28 MED ORDER — ERYTHROMYCIN 5 MG/GM OP OINT: 1.0000 "application " | TOPICAL_OINTMENT | Freq: Once | OPHTHALMIC | 0 refills | Status: AC

## 2021-07-28 MED ORDER — FLUTICASONE PROPIONATE 50 MCG/ACT NA SUSP: 1.0000 | Freq: Every day | NASAL | 0 refills | Status: DC

## 2021-07-28 MED ORDER — OLOPATADINE HCL 0.1 % OP SOLN: 1.0000 [drp] | Freq: Two times a day (BID) | OPHTHALMIC | 1 refills | Status: DC

## 2021-07-28 MED ORDER — FLUORESCEIN SODIUM 1 MG OP STRP
1.0000 | ORAL_STRIP | Freq: Once | OPHTHALMIC | Status: AC
  Administered 2021-07-28: 1 via OPHTHALMIC
  Filled 2021-07-28: qty 1

## 2021-07-28 MED ORDER — TETRACAINE HCL 0.5 % OP SOLN
2.0000 [drp] | Freq: Once | OPHTHALMIC | Status: AC
  Administered 2021-07-28: 2 [drp] via OPHTHALMIC
  Filled 2021-07-28: qty 4

## 2021-07-28 NOTE — ED Provider Notes (Signed)
[facility] EMERGENCY DEPARTMENT Provider Note   CSN: [id number] Arrival date & time: 07/28/21  0747     History  Chief Complaint  Patient presents with   Eye Problem    Crystal Barton is a 31 y.o. female.  31 yo woman presents with several hours of right eye swelling. She notes that when she woke up this morning she had some crust in her right eye and it is a little more swollen and a little bit redder than her left eye. She has no pain in the right eye at all nor with eye movements, no change in vision, no headaches or fevers. She does not wear contacts or glasses. No report of trauma. She notes that her nose feels stuffy and she had sneezing yesterday, she reports she had bad allergies starting last year around this time.       Home Medications Prior to Admission medications   Medication Sig Start Date End Date Taking? Authorizing Provider  erythromycin ophthalmic ointment Place 1 application into the right eye once for 1 dose. For 5 days 07/28/21 07/28/21 Yes Shirlean Mylar, MD  fluticasone [facility]) 50 MCG/ACT nasal spray Place 1 spray into both nostrils daily. 07/28/21 07/28/22 Yes Shirlean Mylar, MD  olopatadine (PATANOL) 0.1 % ophthalmic solution Place 1 drop into both eyes 2 (two) times daily. 07/28/21 07/28/22 Yes Shirlean Mylar, MD  acetaminophen (TYLENOL) 325 MG tablet Take 650 mg by mouth every 6 (six) hours as needed.    [provider]  ibuprofen (ADVIL) 200 MG tablet Take 200 mg by mouth every 6 (six) hours as needed.    [provider]      Allergies    Patient has no known allergies.    Review of Systems   Review of Systems  Constitutional:  Negative for chills and fever.  HENT:  Positive for congestion, sinus pressure and sneezing. Negative for rhinorrhea and sore throat.   Eyes:  Positive for discharge and redness. Negative for photophobia, pain, itching and visual disturbance.  Neurological:  Negative for dizziness and  headaches.  Psychiatric/Behavioral:  Negative for confusion and decreased concentration.   All other systems reviewed and are negative.  Physical Exam Updated Vital Signs BP (!) 152/108    Pulse 84    Temp 98 F (36.7 C) (Oral)    Resp 16    Ht 5\' 7"  (1.702 m)    Wt 90.3 kg    LMP 07/24/2021    SpO2 98%    BMI 31.17 kg/m  Physical Exam Vitals and nursing note reviewed.  Constitutional:      General: She is not in acute distress.    Appearance: Normal appearance. She is not ill-appearing, toxic-appearing or diaphoretic.  HENT:     Head: Normocephalic and atraumatic.     Right Ear: Tympanic membrane, ear canal and external ear normal.     Left Ear: Tympanic membrane, ear canal and external ear normal.     Nose: Congestion present. No rhinorrhea.     Mouth/Throat:     Mouth: Mucous membranes are moist.     Pharynx: Oropharynx is clear. No oropharyngeal exudate or posterior oropharyngeal erythema.  Eyes:     General: No scleral icterus.       Right eye: Discharge present.        Left eye: No discharge.     Extraocular Movements: Extraocular movements intact.     Pupils: Pupils are equal, round, and reactive to light.  Comments: Very subtle injection of R eye. Fluoroscein stain and wood's lamp reveals no corneal defects. No foreign body. Tonopen with normal pressure of 18.  Cardiovascular:     Rate and Rhythm: Normal rate and regular rhythm.  Pulmonary:     Effort: Pulmonary effort is normal.     Breath sounds: Normal breath sounds.  Musculoskeletal:     Cervical back: Normal range of motion and neck supple. No rigidity.  Skin:    General: Skin is warm and dry.     Capillary Refill: Capillary refill takes less than 2 seconds.  Neurological:     General: No focal deficit present.     Mental Status: She is alert and oriented to person, place, and time. Mental status is at baseline.  Psychiatric:        Mood and Affect: Mood normal.        Behavior: Behavior normal.    ED  Results / Procedures / Treatments   Labs (all labs ordered are listed, but only abnormal results are displayed) Labs Reviewed - No data to display  EKG None  Radiology No results found.  Procedures Procedures    Medications Ordered in ED Medications  fluorescein ophthalmic strip 1 strip (1 strip Right Eye Given 07/28/21 0821)  tetracaine (PONTOCAINE) 0.5 % ophthalmic solution 2 drop (2 drops Both Eyes Given by Other 07/28/21 [id number])    ED Course/ Medical Decision Making/ A&P                           Medical Decision Making 31  yo woman with mild conjunctival injection and subtle swelling of right eyelid. Normal exam, no red flag symptoms: visual acuity intact, no pain at all or with eye movements. Dx most consistent with allergic irritation. Bacterial infection less likely, but will give prescription for erythromycin in case it becomes worse. Recommend symptomatic care with olopatadine eye drops, flonase, and cetirizine.  Amount and/or Complexity of Data Reviewed External Data Reviewed: notes. Discussion of management or test interpretation with external provider(s): Performed fluoroscein staining and wood's lamp, checked pressure of eye.  Risk Prescription drug management.         Final Clinical Impression(s) / ED Diagnoses Final diagnoses:  Allergic conjunctivitis of right eye    Rx / DC Orders ED Discharge Orders          Ordered    fluticasone (FLONASE) 50 MCG/ACT nasal spray  Daily        07/28/21 0848    erythromycin ophthalmic ointment   Once        07/28/21 0848    olopatadine (PATANOL) 0.1 % ophthalmic solution  2 times daily        07/28/21 0848              Gladys Damme, MD 07/28/21 1529    Gareth Morgan, MD 07/30/21 0031

## 2021-07-28 NOTE — ED Triage Notes (Signed)
Pt c/o irritation/swelling to RT eye since yesterday

## 2021-08-08 ENCOUNTER — Other Ambulatory Visit: Payer: Self-pay

## 2021-08-08 ENCOUNTER — Encounter: Payer: PRIVATE HEALTH INSURANCE | Attending: General Surgery | Admitting: Skilled Nursing Facility1

## 2021-08-08 DIAGNOSIS — E669 Obesity, unspecified: Secondary | ICD-10-CM | POA: Insufficient documentation

## 2021-08-08 NOTE — Progress Notes (Signed)
Bariatric Nutrition Follow-Up Visit Medical Nutrition Therapy    NUTRITION ASSESSMENT    Surgery date: 12/31/2020 Surgery type: sleeve Start weight at [facility]: 284.5 pounds Weight today: 199.5 pounds   Body Composition Scale 01/15/2021 02/27/2021 05/08/2021  Current Body Weight 260.2 244.6 216.3  Total Body Fat % 44.1 41.4 37.8  Visceral Fat 11 10 8   Fat-Free Mass % 55.8 58.5 62.1   Total Body Water % 42.4 43.7 45.5  Muscle-Mass lbs 34.6 35.7 35.5  BMI 40 37.6 33.2  Body Fat Displacement            Torso  lbs 71.2 62.8 50.7         Left Leg  lbs 14.2 12.5 10.1         Right Leg  lbs 14.2 12.5 10.1         Left Arm  lbs 7.1 6.2 5.0         Right Arm   lbs 7.1 6.2 5.0   Clinical  Medical hx:  Medications: see list  Labs:    Lifestyle & Dietary Hx  Pt states she cannot eat chocolate or cake because it gives her a headache.  Pt states she much rather move now and does not like to sit still.  Pt states she does not really want to lose more weight.    Estimated daily fluid intake: 64+ oz Estimated daily protein intake: 80+ g Supplements: multi and calcium and hair skin and nail vitamin Current average weekly physical activity: treadmill or stair stepper walking 3 days a week with light weights working out with her aunt to keep her motivated   24-Hr Dietary Recall First Meal: protein shake or egg + sausage or yogurt Snack:  cheese and crackers Second Meal: salad: romaine + chicken + egg + tomato + cucumber + cheese  + ranch or tuna or chicken Snack:   Third Meal: chicken or steak + brussels sprout or green beans sometimes rice or sweet potatoes Snack:  Beverages: water, diet green tea, gatorade zero  Post-Op Goals/ Signs/ Symptoms Using straws: no Drinking while eating: no Chewing/swallowing difficulties: no Changes in vision: no Changes to mood/headaches: no Hair loss/changes to skin/nails: no Difficulty focusing/concentrating: no Sweating:  no Dizziness/lightheadedness: non Palpitations: on  Carbonated/caffeinated beverages: no N/V/D/C/Gas: no Abdominal pain: no Dumping syndrome: no    NUTRITION DIAGNOSIS  Overweight/obesity ([place]-3.3) related to past poor dietary habits and physical inactivity as evidenced by completed bariatric surgery and following dietary guidelines for continued weight loss and healthy nutrition status.     NUTRITION INTERVENTION Nutrition counseling (C-1) and education (E-2) to facilitate bariatric surgery goals, including: The importance of consuming adequate calories as well as certain nutrients daily due to the body's need for essential vitamins, minerals, and fats The importance of daily physical activity and to reach a goal of at least 150 minutes of moderate to vigorous physical activity weekly (or as directed by their physician) due to benefits such as increased musculature and improved lab values The importance of intuitive eating specifically learning hunger-satiety cues and understanding the importance of learning a new body: The importance of mindful eating to avoid grazing behaviors  Encouraged patient to honor their body's internal hunger and fullness cues.  Throughout the day, check in mentally and rate hunger. Stop eating when satisfied not full regardless of how much food is left on the plate.  Get more if still hungry 20-30 minutes later.  The key is to honor satisfaction so throughout the  meal, rate fullness factor and stop when comfortably satisfied not physically full. The key is to honor hunger and fullness without any feelings of guilt or shame.  Pay attention to what the internal cues are, rather than any external factors. This will enhance the confidence you have in listening to your own body and following those internal cues enabling you to increase how often you eat when you are hungry not out of appetite and stop when you are satisfied not full.  Encouraged pt to continue to eat  balanced meals inclusive of non starchy vegetables 2 times a day 7 days a week Encouraged pt to choose lean protein sources: limiting beef, pork, sausage, hotdogs, and lunch meat Encourage pt to choose healthy fats such as plant based limiting animal fats Encouraged pt to continue to drink a minium 64 fluid ounces with half being plain water to satisfy proper hydration     Handouts Provided Include  Phase 7  Learning Style & Readiness for Change Teaching method utilized: Visual & Auditory  Demonstrated degree of understanding via: Teach Back  Readiness Level: Action Barriers to learning/adherence to lifestyle change: none identified   RD's Notes for Next Visit Assess adherence to pt chosen goals    MONITORING & EVALUATION Dietary intake, weekly physical activity, body weight  Next Steps Patient is to follow-up in July

## 2021-10-28 ENCOUNTER — Encounter: Payer: Self-pay | Admitting: Nurse Practitioner

## 2021-10-28 ENCOUNTER — Ambulatory Visit (INDEPENDENT_AMBULATORY_CARE_PROVIDER_SITE_OTHER): Payer: PRIVATE HEALTH INSURANCE | Admitting: Nurse Practitioner

## 2021-10-28 VITALS — BP 122/72 | Ht 67.0 in | Wt 188.0 lb

## 2021-10-28 DIAGNOSIS — Z01419 Encounter for gynecological examination (general) (routine) without abnormal findings: Secondary | ICD-10-CM | POA: Diagnosis not present

## 2021-10-28 DIAGNOSIS — Z113 Encounter for screening for infections with a predominantly sexual mode of transmission: Secondary | ICD-10-CM | POA: Diagnosis not present

## 2021-10-28 NOTE — Progress Notes (Signed)
? ?  Crystal Barton 07/13/1990 [id number] ? ? ?History:  31 y.o. G1P0010 presents for annual exam without GYN complaints. Monthly cycles/condoms. 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 Received Gardasil. 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  ? ?Gynecologic History ?Patient's last menstrual period was 10/18/2021. ?Period Cycle (Days): 28 ?Period Duration (Days): 7 ?Period Pattern: Regular ?Menstrual Flow: Heavy ?Dysmenorrhea: (!) Severe ?Dysmenorrhea Symptoms: Cramping ?Contraception/Family planning: condoms ?Sexually active: Yes ? ?Health Maintenance ?Last Pap: 09/19/2019. Results were: Normal, 3-year recall ?Last mammogram: Not indicated ?Last colonoscopy: Not indicated ?Last Dexa: Not indicated  ? ?Past medical history, past surgical history, family history and social history were all reviewed and documented in the EPIC chart. CNA at [employer].  ? ?ROS:  A ROS was performed and pertinent positives and negatives are included. ? ?Exam: ? ?Vitals:  ? 10/28/21 1523  ?BP: 122/72  ?Weight: 188 lb (85.3 kg)  ?Height: 5\' 7"  (1.702 m)  ? ? ?Body mass index is 29.44 kg/m?. ? ?General appearance:  Normal ?Thyroid:  Symmetrical, normal in size, without palpable masses or nodularity. ?Respiratory ? Auscultation:  Clear without wheezing or rhonchi ?Cardiovascular ? Auscultation:  Regular rate, without rubs, murmurs or gallops ? Edema/varicosities:  Not grossly evident ?Abdominal ? Soft,nontender, without masses, guarding or rebound. ? Liver/spleen:  No organomegaly noted ? Hernia:  None appreciated ? Skin ? Inspection:  Grossly normal ?Breasts: Examined lying and sitting.  ? Right: Without masses, retractions, nipple discharge or axillary adenopathy. ? ? Left: Without masses, retractions, nipple discharge or axillary adenopathy. ?Genitourinary  ? Inguinal/mons:  Normal without inguinal adenopathy ? External genitalia:  Normal appearing vulva with no masses, tenderness, or  lesions ? BUS/Urethra/Skene's glands:  Normal ? Vagina:  Normal appearing with normal color and discharge, no lesions ? Cervix:  Normal appearing without discharge or lesions ? Uterus:  Difficult to palpate due to body habitus but no gross masses or tenderness ? Adnexa/parametria:   ?  Rt: Normal in size, without masses or tenderness. ?  Lt: Normal in size, without masses or tenderness. ? Anus and perineum: Normal ? ?Patient informed chaperone available to be present for breast and pelvic exam. Patient has requested no chaperone to be present. Patient has been advised what will be completed during breast and pelvic exam.  ? ?Assessment/Plan:  31 y.o. G1P0010 for annual exam.  ? ?Well woman exam with routine gynecological exam - Education provided on SBEs, importance of preventative screenings, current guidelines, high calcium diet, regular exercise, and multivitamin daily. Labs with PCP.  ? ?Screen for STD (sexually transmitted disease) - Plan: RPR, HIV Antibody (routine testing w rflx), SURESWAB CT/NG/T. vaginalis ? 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 Will repeat at 3-year interval per guidelines. ? ?Return in 1 year for annual.  ? ? ? ?2019 DNP, 3:49 PM 10/28/2021 ? ?

## 2021-10-29 LAB — RPR: RPR Ser Ql: NONREACTIVE

## 2021-10-29 LAB — HIV ANTIBODY (ROUTINE TESTING W REFLEX): HIV 1&2 Ab, 4th Generation: NONREACTIVE

## 2021-10-29 LAB — SURESWAB CT/NG/T. VAGINALIS
C. trachomatis RNA, TMA: NOT DETECTED
N. gonorrhoeae RNA, TMA: NOT DETECTED
Trichomonas vaginalis RNA: NOT DETECTED

## 2021-12-26 ENCOUNTER — Encounter: Payer: Self-pay | Admitting: Skilled Nursing Facility1

## 2021-12-26 ENCOUNTER — Encounter: Payer: PRIVATE HEALTH INSURANCE | Attending: General Surgery | Admitting: Skilled Nursing Facility1

## 2021-12-26 DIAGNOSIS — Z713 Dietary counseling and surveillance: Secondary | ICD-10-CM | POA: Insufficient documentation

## 2021-12-26 DIAGNOSIS — E669 Obesity, unspecified: Secondary | ICD-10-CM

## 2021-12-26 NOTE — Progress Notes (Signed)
Bariatric Nutrition Follow-Up Visit Medical Nutrition Therapy    NUTRITION ASSESSMENT    Surgery date: 12/31/2020 Surgery type: sleeve Start weight at [facility]: 284.5 pounds Weight today: 188 pounds   Body Composition Scale 01/15/2021 02/27/2021 05/08/2021 12/26/2021  Current Body Weight 260.2 244.6 216.3 188  Total Body Fat % 44.1 41.4 37.8 33.4  Visceral Fat 11 10 8 7   Fat-Free Mass % 55.8 58.5 62.1 66.5   Total Body Water % 42.4 43.7 45.5 47.7  Muscle-Mass lbs 34.6 35.7 35.5 34.8  BMI 40 37.6 33.2 29.1  Body Fat Displacement             Torso  lbs 71.2 62.8 50.7 38.8         Left Leg  lbs 14.2 12.5 10.1 7.7         Right Leg  lbs 14.2 12.5 10.1 7.7         Left Arm  lbs 7.1 6.2 5.0 3.8         Right Arm   lbs 7.1 6.2 5.0 3.8   Clinical  Medical hx:  Medications: see list  Labs:    Lifestyle & Dietary Hx   Pt state she tried some medicine for anxiety and depression but did not liked the effects so stopped taking it.  Pt states she works 7a-7p working 3 days and sometimes 4 days.  Pt states her knee feels funny: dietitian advised pt to have for her to have her doctor look at it.  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  Pt states she does not have an appt with her surgeon: dietitian advised pt to make her one year follow up. Pt states she got labs through her PCP (not in EMR): pt showed her chart white blood cell 3.7.    Estimated daily fluid intake: 64+ oz Estimated daily protein intake: 80+ g Supplements: multi and calcium and hair skin and nail vitamin Current average weekly physical activity: workouts at the gym at her work hittign 2 days a week   24-Hr Dietary Recall First Meal: protein shake or egg + sausage + fruit  Snack:  if not having eaten breakfast then fruit or crackers and cheese  Second Meal: salad: romaine + chicken + egg + tomato + cucumber + cheese  + onion + ranch or half a sweet potato + chicken + brussels or carrots Snack:  sometimes  nuts or fruit Third Meal: sandwich or brown rice and meat and brussles  Snack:  Beverages: water, diet green tea, gatorade zero  Post-Op Goals/ Signs/ Symptoms Using straws: no Drinking while eating: no Chewing/swallowing difficulties: no Changes in vision: no Changes to mood/headaches: no Hair loss/changes to skin/nails: no Difficulty focusing/concentrating: no Sweating: no Dizziness/lightheadedness: non Palpitations: on  Carbonated/caffeinated beverages: no N/V/D/C/Gas: no Abdominal pain: no Dumping syndrome: no    NUTRITION DIAGNOSIS  Overweight/obesity ([place]-3.3) related to past poor dietary habits and physical inactivity as evidenced by completed bariatric surgery and following dietary guidelines for continued weight loss and healthy nutrition status.     NUTRITION INTERVENTION Nutrition counseling (C-1) and education (E-2) to facilitate bariatric surgery goals, including: The importance of consuming adequate calories as well as certain nutrients daily due to the body's need for essential vitamins, minerals, and fats The importance of daily physical activity and to reach a goal of at least 150 minutes of moderate to vigorous physical activity weekly (or as directed by their physician) due to benefits such as increased musculature  and improved lab values The importance of intuitive eating specifically learning hunger-satiety cues and understanding the importance of learning a new body: The importance of mindful eating to avoid grazing behaviors  Encouraged patient to honor their body's internal hunger and fullness cues.  Throughout the day, check in mentally and rate hunger. Stop eating when satisfied not full regardless of how much food is left on the plate.  Get more if still hungry 20-30 minutes later.  The key is to honor satisfaction so throughout the meal, rate fullness factor and stop when comfortably satisfied not physically full. The key is to honor hunger and fullness  without any feelings of guilt or shame.  Pay attention to what the internal cues are, rather than any external factors. This will enhance the confidence you have in listening to your own body and following those internal cues enabling you to increase how often you eat when you are hungry not out of appetite and stop when you are satisfied not full.  Encouraged pt to continue to eat balanced meals inclusive of non starchy vegetables 2 times a day 7 days a week Encouraged pt to choose lean protein sources: limiting beef, pork, sausage, hotdogs, and lunch meat Encourage pt to choose healthy fats such as plant based limiting animal fats Encouraged pt to continue to drink a minium 64 fluid ounces with half being plain water to satisfy proper hydration    Handouts Previously Provided Include  Phase 7  Learning Style & Readiness for Change Teaching method utilized: Visual & Auditory  Demonstrated degree of understanding via: Teach Back  Readiness Level: Action Barriers to learning/adherence to lifestyle change: none identified   RD's Notes for Next Visit Assess adherence to pt chosen goals    MONITORING & EVALUATION Dietary intake, weekly physical activity, body weight  Next Steps Patient is to follow-up as needed: pt advised to call or email with any questions or concerns

## 2022-01-10 ENCOUNTER — Encounter: Payer: Self-pay | Admitting: Nurse Practitioner

## 2022-01-10 ENCOUNTER — Ambulatory Visit (INDEPENDENT_AMBULATORY_CARE_PROVIDER_SITE_OTHER): Payer: PRIVATE HEALTH INSURANCE | Admitting: Nurse Practitioner

## 2022-01-10 VITALS — BP 126/84 | HR 66

## 2022-01-10 DIAGNOSIS — N921 Excessive and frequent menstruation with irregular cycle: Secondary | ICD-10-CM

## 2022-01-10 LAB — PREGNANCY, URINE: Preg Test, Ur: NEGATIVE

## 2022-01-10 NOTE — Progress Notes (Signed)
   Acute Office Visit  Subjective:    Patient ID: Crystal Barton, female    DOB: Aug 30, 1990, 31 y.o.   MRN: [id number]   HPI 31 y.o. presents today for breakthrough bleeding. Had menses 12/13/2021, then had spotting 12/29/2021 with continual brown spotting until menses started 01/08/2022. Negative STD screening 10/28/2021, declines repeat, same partner. Uses condoms for contraception.    Review of Systems  Constitutional: Negative.   Genitourinary:  Positive for menstrual problem.       Objective:    Physical Exam Constitutional:      Appearance: Normal appearance.  Genitourinary:    General: Normal vulva.     Vagina: Normal.     Cervix: Normal.     Uterus: Normal.      Comments: Menstrual blood present    BP 126/84   Pulse 66   LMP 01/08/2022 (Exact Date)   SpO2 99%  Wt Readings from Last 3 Encounters:  12/26/21 188 lb (85.3 kg)  10/28/21 188 lb (85.3 kg)  08/08/21 199 lb 8 oz (90.5 kg)        Patient informed chaperone available to be present for breast and/or pelvic exam. Patient has requested no chaperone to be present. Patient has been advised what will be completed during breast and pelvic exam.   UPT negative  Assessment & Plan:   Problem List Items Addressed This Visit   None Visit Diagnoses     Breakthrough bleeding    -  Primary   Relevant Orders   Pregnancy, urine      Plan: Negative UPT. Declines STD screening. First month of abnormal spotting, so she will continue to monitor.      [name] DNP, 4:16 PM 01/10/2022

## 2022-03-06 ENCOUNTER — Ambulatory Visit: Payer: PRIVATE HEALTH INSURANCE | Admitting: Nurse Practitioner

## 2022-03-06 ENCOUNTER — Encounter: Payer: Self-pay | Admitting: Nurse Practitioner

## 2022-03-06 VITALS — BP 138/90 | HR 94

## 2022-03-06 DIAGNOSIS — I10 Essential (primary) hypertension: Secondary | ICD-10-CM

## 2022-03-06 DIAGNOSIS — Z789 Other specified health status: Secondary | ICD-10-CM

## 2022-03-06 NOTE — Progress Notes (Signed)
Subjective:  Patient ID: Crystal Barton, female    DOB: [birth year]  Age: 31 y.o. MRN: [id number]  CC: Wellness exam  HPI Crystal Barton presents for wellness exam visit for insurance benefit.  Patient has a PCP: Minette Brine PA at [place]  PMH significant for: Asthma, HTN not on medication. Was prescribed BP medication per PCP but patient has been hesitant to start this.  Last labs per PCP were completed: May 2023  Health Maintenance:  Pap Smear: up to date June 2023    Smoker: Former  Immunizations:  COVID- x 1 (moderna)  Lifestyle: Diet- Lost 100 pounds due to weight loss surgery last year. Avoids fried food. Eats small portion, mostly bakes proteins and eats vegetables.  Exercise- 3 times a week.      Past Medical History:  Diagnosis Date   Anxiety    Asthma    as a child   Hypertension    STD (sexually transmitted disease)    Chlamydia    Past Surgical History:  Procedure Laterality Date   LAPAROSCOPIC GASTRIC SLEEVE RESECTION N/A 12/31/2020   Procedure: LAPAROSCOPIC GASTRIC SLEEVE RESECTION, WITH HIATAL HERNIA REPAIR;  Surgeon: Kinsinger, Arta Bruce, MD;  Location: [facility];  Service: General;  Laterality: N/A;   SHOULDER SURGERY Right 07/2019   ligament repair   UPPER GI ENDOSCOPY N/A 12/31/2020   Procedure: UPPER GI ENDOSCOPY;  Surgeon: Mickeal Skinner, MD;  Location: [facility];  Service: General;  Laterality: N/A;    Outpatient Medications Prior to Visit  Medication Sig Dispense Refill   terbinafine (LAMISIL) 250 MG tablet      acetaminophen (TYLENOL) 325 MG tablet Take 650 mg by mouth every 6 (six) hours as needed.     No facility-administered medications prior to visit.    ROS Review of Systems  Respiratory:  Negative for shortness of breath.   Cardiovascular:  Negative for chest pain and leg swelling.  Gastrointestinal:  Negative for constipation and diarrhea.  Neurological:  Negative for headaches.    Objective:  BP (!) 138/90   Pulse 94   SpO2 99%    Physical Exam Constitutional:      General: She is not in acute distress. HENT:     Head: Normocephalic.  Cardiovascular:     Rate and Rhythm: Normal rate and regular rhythm.     Heart sounds: Normal heart sounds.  Pulmonary:     Effort: Pulmonary effort is normal.     Breath sounds: Normal breath sounds.  Musculoskeletal:        General: Normal range of motion.     Right lower leg: No edema.     Left lower leg: No edema.  Skin:    General: Skin is warm.  Neurological:     General: No focal deficit present.     Mental Status: She is alert and oriented to person, place, and time.  Psychiatric:        Mood and Affect: Mood normal.        Behavior: Behavior normal.      Assessment & Plan:    Crystal Barton was seen today for wellness exam.  Diagnoses and all orders for this visit:  Participant in health and wellness plan Adult wellness physical was conducted today. Importance of diet and exercise were discussed in detail.  We reviewed immunizations and gave recommendations regarding current immunization needed for age.  Preventative health exams up to date. Patient was advised yearly wellness exam  Primary hypertension Discussed elevated  BP levels today. Encouraged DASH diet and avoidance of caffeine. Instructed to monitor BP readings at home and if they remain elevated, she should start treatment as prescribed by her PCP.    No orders of the defined types were placed in this encounter.   No orders of the defined types were placed in this encounter.   Follow-up: As needed

## 2022-07-28 ENCOUNTER — Encounter (HOSPITAL_COMMUNITY): Payer: Self-pay | Admitting: *Deleted

## 2022-09-07 ENCOUNTER — Emergency Department (HOSPITAL_BASED_OUTPATIENT_CLINIC_OR_DEPARTMENT_OTHER)
Admission: EM | Admit: 2022-09-07 | Discharge: 2022-09-07 | Disposition: A | Discharge status: Home / Self Care | Payer: PRIVATE HEALTH INSURANCE | Attending: Emergency Medicine | Admitting: Emergency Medicine

## 2022-09-07 ENCOUNTER — Encounter (HOSPITAL_BASED_OUTPATIENT_CLINIC_OR_DEPARTMENT_OTHER): Payer: Self-pay | Admitting: Urology

## 2022-09-07 ENCOUNTER — Other Ambulatory Visit: Payer: Self-pay

## 2022-09-07 DIAGNOSIS — Z1152 Encounter for screening for COVID-19: Secondary | ICD-10-CM | POA: Insufficient documentation

## 2022-09-07 DIAGNOSIS — J069 Acute upper respiratory infection, unspecified: Secondary | ICD-10-CM | POA: Insufficient documentation

## 2022-09-07 LAB — RESP PANEL BY RT-PCR (RSV, FLU A&B, COVID)  RVPGX2
Influenza A by PCR: NEGATIVE
Influenza B by PCR: NEGATIVE
Resp Syncytial Virus by PCR: NEGATIVE
SARS Coronavirus 2 by RT PCR: NEGATIVE

## 2022-09-07 NOTE — ED Notes (Signed)
Pt had energy drink PTA and states that is why her heart rate is elevated

## 2022-09-07 NOTE — ED Provider Notes (Signed)
[facility] Provider Note   CSN: [id number] Arrival date & time: 09/07/22  1330     History  Chief Complaint  Patient presents with   Flu like symptoms    Crystal Barton is a 32 y.o. female, no pertinent past medical history, who presents to the ED secondary to cough, runny nose, and congestion for the last 4 days.  States she also had a fever last Thursday, but does not know what the temperature was.  Notes that she took some DayQuil, and it helped with her fever and her body aches, but her symptoms have persisted and now she has bilateral ear fullness today so she went to come to the ER.  Took a COVID test at home and it was negative.  Denies any sick contacts.  Denies any shortness of breath, chest pain, sore throat.     Home Medications Prior to Admission medications   Medication Sig Start Date End Date Taking? Authorizing Provider  acetaminophen (TYLENOL) 325 MG tablet Take 650 mg by mouth every 6 (six) hours as needed.    [provider]  terbinafine (LAMISIL) 250 MG tablet     [provider]      Allergies    Patient has no known allergies.    Review of Systems   Review of Systems  Constitutional:  Positive for chills, fatigue and fever.  Respiratory:  Positive for cough. Negative for shortness of breath.     Physical Exam Updated Vital Signs BP (!) 168/101 (BP Location: Right Arm)   Pulse (!) 124   Temp 98.7 F (37.1 C) (Oral)   Resp 18   Ht 5\' 7"  (1.702 m)   Wt 86.2 kg   LMP 08/21/2022 (Approximate)   SpO2 97%   BMI 29.76 kg/m  Physical Exam Vitals and nursing note reviewed.  Constitutional:      General: She is not in acute distress.    Appearance: She is well-developed.  HENT:     Head: Normocephalic and atraumatic.     Right Ear: Tympanic membrane normal.     Left Ear: Tympanic membrane normal.     Nose: Congestion present.  Eyes:     Conjunctiva/sclera: Conjunctivae normal.   Cardiovascular:     Rate and Rhythm: Normal rate and regular rhythm.     Heart sounds: No murmur heard. Pulmonary:     Effort: Pulmonary effort is normal. No respiratory distress.     Breath sounds: Normal breath sounds.  Abdominal:     Palpations: Abdomen is soft.     Tenderness: There is no abdominal tenderness.  Musculoskeletal:        General: No swelling.     Cervical back: Neck supple.  Skin:    General: Skin is warm and dry.     Capillary Refill: Capillary refill takes less than 2 seconds.  Neurological:     Mental Status: She is alert.  Psychiatric:        Mood and Affect: Mood normal.     ED Results / Procedures / Treatments   Labs (all labs ordered are listed, but only abnormal results are displayed) Labs Reviewed  RESP PANEL BY RT-PCR (RSV, FLU A&B, COVID)  [id number]    EKG None  Radiology No results found.  Procedures Procedures    Medications Ordered in ED Medications - No data to display  ED Course/ Medical Decision Making/ A&P  Medical Decision Making Patient is a well-appearing 32 year old female, here for upper respiratory symptoms.  Overall is well-appearing, and has no bulging of her TMs bilaterally.  Throat is clear, no exudates noted.  Lung sounds are clear.  We discussed viral panel testing, she would like the panel to be tested, however does not want to wait for results.  Offered Best boy for discharge, and she declined.  She has no coarse breath sounds so to no chest x-ray at this time.  Have her follow-up with PCP as needed.  Return precautions emphasized.    Final Clinical Impression(s) / ED Diagnoses Final diagnoses:  Viral upper respiratory illness    Rx / DC Orders ED Discharge Orders     None         Osvaldo Shipper, PA 09/07/22 1356    Margette Fast, MD 09/12/22 1538

## 2022-09-07 NOTE — Discharge Instructions (Addendum)
Please follow-up with your primary care doctor as needed.  If you have severe shortness of breath, intractable nausea, vomiting, please return to the ER.  You can take Tylenol and ibuprofen for your pain/body aches.  Make sure you wear a mask around others while you are sick.  I offered Tessalon Perles, and you declined, so you should use over-the-counter cough syrup to help with your cough.

## 2022-09-07 NOTE — ED Triage Notes (Signed)
Pt states fever since Thursday with runny nose and congestion States cough and bilateral ear fullness that started today

## 2022-11-04 ENCOUNTER — Ambulatory Visit (INDEPENDENT_AMBULATORY_CARE_PROVIDER_SITE_OTHER): Payer: PRIVATE HEALTH INSURANCE | Admitting: Nurse Practitioner

## 2022-11-04 ENCOUNTER — Encounter: Payer: Self-pay | Admitting: Nurse Practitioner

## 2022-11-04 ENCOUNTER — Other Ambulatory Visit (HOSPITAL_COMMUNITY)
Admission: RE | Admit: 2022-11-04 | Discharge: 2022-11-04 | Disposition: A | Discharge status: Home / Self Care | Payer: PRIVATE HEALTH INSURANCE | Source: Ambulatory Visit | Attending: Nurse Practitioner | Admitting: Nurse Practitioner

## 2022-11-04 VITALS — BP 130/86 | Ht 68.0 in | Wt 194.0 lb

## 2022-11-04 DIAGNOSIS — Z124 Encounter for screening for malignant neoplasm of cervix: Secondary | ICD-10-CM

## 2022-11-04 DIAGNOSIS — Z113 Encounter for screening for infections with a predominantly sexual mode of transmission: Secondary | ICD-10-CM

## 2022-11-04 DIAGNOSIS — Z01419 Encounter for gynecological examination (general) (routine) without abnormal findings: Secondary | ICD-10-CM | POA: Diagnosis not present

## 2022-11-04 DIAGNOSIS — N921 Excessive and frequent menstruation with irregular cycle: Secondary | ICD-10-CM | POA: Diagnosis not present

## 2022-11-04 DIAGNOSIS — N926 Irregular menstruation, unspecified: Secondary | ICD-10-CM

## 2022-11-04 LAB — PREGNANCY, URINE: Preg Test, Ur: NEGATIVE

## 2022-11-04 MED ORDER — NORETHIN ACE-ETH ESTRAD-FE 1-20 MG-MCG PO TABS: 1.0000 | ORAL_TABLET | Freq: Every day | ORAL | 3 refills | Status: DC

## 2022-11-04 NOTE — Progress Notes (Signed)
   Crystal Barton Aug 05, 1990 [id number]   History:  32 y.o. G1P0010 presents for annual exam. Having more frequent menses. Sometimes occurring twice a month. Flow varies from moderate to heavy. Hgb 12 in April with PCP. 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 Completed Gardasil series. 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 HTN noted in history but not on medication.   Gynecologic History Patient's last menstrual period was 10/15/2022 (exact date). Period Pattern: (!) Irregular Menstrual Flow: Heavy Menstrual Control: Maxi pad, Thin pad Dysmenorrhea: (!) Moderate Dysmenorrhea Symptoms: Cramping Contraception/Family planning: condoms Sexually active: Yes  Health Maintenance Last Pap: 09/19/2019. Results were: Normal, 3-year recall Last mammogram: Not indicated Last colonoscopy: Not indicated Last Dexa: Not indicated   Past medical history, past surgical history, family history and social history were all reviewed and documented in the EPIC chart. 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   ROS:  A ROS was performed and pertinent positives and negatives are included.  Exam:  Vitals:   11/04/22 1007  BP: 130/86  Weight: 194 lb (88 kg)  Height: 5\' 8"  (1.727 m)     Body mass index is 29.5 kg/m.  General appearance:  Normal Thyroid:  Symmetrical, normal in size, without palpable masses or nodularity. Respiratory  Auscultation:  Clear without wheezing or rhonchi Cardiovascular  Auscultation:  Regular rate, without rubs, murmurs or gallops  Edema/varicosities:  Not grossly evident Abdominal  Soft,nontender, without masses, guarding or rebound.  Liver/spleen:  No organomegaly noted  Hernia:  None appreciated  Skin  Inspection:  Grossly normal Breasts: Examined lying and sitting.   Right: Without masses, retractions, nipple discharge or axillary adenopathy.   Left: Without masses, retractions, nipple discharge or axillary adenopathy. Genitourinary    Inguinal/mons:  Normal without inguinal adenopathy  External genitalia:  Normal appearing vulva with no masses, tenderness, or lesions  BUS/Urethra/Skene's glands:  Normal  Vagina:  Normal appearing with normal color and discharge, no lesions  Cervix:  Normal appearing without discharge or lesions  Uterus:  Normal in size, shape and contour.  Midline and mobile, nontender  Adnexa/parametria:     Rt: Normal in size, without masses or tenderness.   Lt: Normal in size, without masses or tenderness.  Anus and perineum: Normal  Patient informed chaperone available to be present for breast and pelvic exam. Patient has requested no chaperone to be present. Patient has been advised what will be completed during breast and pelvic exam.   UPT negative  Assessment/Plan:  32 y.o. G1P0010 for annual exam.   Well woman exam with routine gynecological exam - Education provided on SBEs, importance of preventative screenings, current guidelines, high calcium diet, regular exercise, and multivitamin daily.   Screening for cervical cancer - Plan: Cytology - PAP( [facility]). 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  Menorrhagia with irregular cycle - Plan: norethindrone-ethinyl estradiol-FE (LOESTRIN FE) 1-20 MG-MCG tablet daily. No contraindications. HTN noted in history but not on medications. BP 130/86 today. Recommend checking routinely first few weeks. Educated on proper use. If regulation does not improve we will schedule ultrasound.   Menstrual changes - Plan: Pregnancy, urine, TSH. Negative UPT.   Screening examination for STD (sexually transmitted disease) - Plan: Cytology - PAP( [facility]). GC/CT/trich added to pap.   Return in 1 year for annual.     [name] DNP, 10:36 AM 11/04/2022

## 2022-11-05 LAB — TSH: TSH: 1.39 mIU/L

## 2022-11-11 LAB — CYTOLOGY - PAP
Chlamydia: NEGATIVE
Comment: NEGATIVE
Comment: NEGATIVE
Comment: NEGATIVE
Comment: NORMAL
Diagnosis: NEGATIVE
High risk HPV: NEGATIVE
Neisseria Gonorrhea: NEGATIVE
Trichomonas: NEGATIVE

## 2022-11-18 ENCOUNTER — Inpatient Hospital Stay: Payer: PRIVATE HEALTH INSURANCE

## 2022-11-18 ENCOUNTER — Other Ambulatory Visit: Payer: Self-pay

## 2022-11-18 ENCOUNTER — Inpatient Hospital Stay: Payer: PRIVATE HEALTH INSURANCE | Attending: Hematology | Admitting: Hematology

## 2022-11-18 VITALS — BP 140/100 | HR 111 | Temp 97.9°F | Resp 20 | Wt 194.5 lb

## 2022-11-18 DIAGNOSIS — D72819 Decreased white blood cell count, unspecified: Secondary | ICD-10-CM

## 2022-11-18 LAB — CBC WITH DIFFERENTIAL (CANCER CENTER ONLY)
Abs Immature Granulocytes: 0 10*3/uL (ref 0.00–0.07)
Basophils Absolute: 0 10*3/uL (ref 0.0–0.1)
Basophils Relative: 1 %
Eosinophils Absolute: 0 10*3/uL (ref 0.0–0.5)
Eosinophils Relative: 1 %
HCT: 37.4 % (ref 36.0–46.0)
Hemoglobin: 12.8 g/dL (ref 12.0–15.0)
Immature Granulocytes: 0 %
Lymphocytes Relative: 41 %
Lymphs Abs: 1.4 10*3/uL (ref 0.7–4.0)
MCH: 30.4 pg (ref 26.0–34.0)
MCHC: 34.2 g/dL (ref 30.0–36.0)
MCV: 88.8 fL (ref 80.0–100.0)
Monocytes Absolute: 0.4 10*3/uL (ref 0.1–1.0)
Monocytes Relative: 11 %
Neutro Abs: 1.6 10*3/uL — ABNORMAL LOW (ref 1.7–7.7)
Neutrophils Relative %: 46 %
Platelet Count: 281 10*3/uL (ref 150–400)
RBC: 4.21 MIL/uL (ref 3.87–5.11)
RDW: 14 % (ref 11.5–15.5)
WBC Count: 3.4 10*3/uL — ABNORMAL LOW (ref 4.0–10.5)
nRBC: 0 % (ref 0.0–0.2)

## 2022-11-18 LAB — CMP (CANCER CENTER ONLY)
ALT: 38 U/L (ref 0–44)
AST: 40 U/L (ref 15–41)
Albumin: 4.6 g/dL (ref 3.5–5.0)
Alkaline Phosphatase: 45 U/L (ref 38–126)
Anion gap: 9 (ref 5–15)
BUN: 12 mg/dL (ref 6–20)
CO2: 27 mmol/L (ref 22–32)
Calcium: 9.7 mg/dL (ref 8.9–10.3)
Chloride: 104 mmol/L (ref 98–111)
Creatinine: 0.7 mg/dL (ref 0.44–1.00)
GFR, Estimated: >60 mL/min (ref >60)
Glucose, Bld: 89 mg/dL (ref 70–99)
Potassium: 4.3 mmol/L (ref 3.5–5.1)
Sodium: 140 mmol/L (ref 135–145)
Total Bilirubin: 0.6 mg/dL (ref 0.3–1.2)
Total Protein: 8.1 g/dL (ref 6.5–8.1)

## 2022-11-18 LAB — HEPATITIS C ANTIBODY: HCV Ab: NONREACTIVE

## 2022-11-18 LAB — LACTATE DEHYDROGENASE: LDH: 84 U/L — ABNORMAL LOW (ref 98–192)

## 2022-11-18 LAB — VITAMIN B12: Vitamin B-12: 303 pg/mL (ref 180–914)

## 2022-11-18 NOTE — Progress Notes (Signed)
HEMATOLOGY/ONCOLOGY CONSULTATION NOTE  Date of Service: 11/18/2022  Patient Care Team: Delma Officer, PA as PCP - General (Internal Medicine)  CHIEF COMPLAINTS/PURPOSE OF CONSULTATION:  evaluation of persistent low WBC count for the last year  HISTORY OF PRESENTING ILLNESS:   Crystal Barton is a wonderful 32 y.o. female who has been referred to [place] by PA Eliane Decree for evaluation and management of Leukopenia.  Patient has been referred to [place] by her PCP for evaluation of persistent low WBC count for the last year. Patient has had bariatric surgery.   Patient notes she has been doing well overall for the past year. She notes she had gastric sleeve surgery around 2 years ago. She notes she has menstrual cycle bleeding and is currently taking birth control.   Patient currently takes multi-vitamin supplement.   Patient has seasonal allergies, which started around 2 years ago. She notes her allergy symptoms were worse this year. She takes OTC allergy medication.   Patient notes she has lost around 80-90 lbs since she had her gastric sleeve surgery. She used to take phentramine for weight management before her gastric sleeve surgery.   Patient previously had Hidradenitis suppurativa infection, which does not flare up after her gastric sleeve surgery.   She denies fever, chills, night sweats, skin rashes, unexpected weight loss, abdominal pain, chest pain, back pain, or leg swelling. She does complain of occasional mild left lower abdominal distention, but denies pain.   She denies any recent usage of antibiotics.    Patient notes his maternal grandmother had breast cancer. She also notes that her uncles did have cancer, but is not sure of the names. She denies any family history of blood disorder.    MEDICAL HISTORY:  Past Medical History:  Diagnosis Date   Anxiety    Asthma    as a child   Hypertension    STD (sexually transmitted disease)    Chlamydia    SURGICAL  HISTORY: Past Surgical History:  Procedure Laterality Date   LAPAROSCOPIC GASTRIC SLEEVE RESECTION N/A 12/31/2020   Procedure: LAPAROSCOPIC GASTRIC SLEEVE RESECTION, WITH HIATAL HERNIA REPAIR;  Surgeon: Kinsinger, De Blanch, MD;  Location: [facility];  Service: General;  Laterality: N/A;   SHOULDER SURGERY Right 07/2019   ligament repair   UPPER GI ENDOSCOPY N/A 12/31/2020   Procedure: UPPER GI ENDOSCOPY;  Surgeon: Sheliah Hatch De Blanch, MD;  Location: [facility];  Service: General;  Laterality: N/A;    SOCIAL HISTORY: Social History   Socioeconomic History   Marital status: Single    Spouse name: Not on file   Number of children: Not on file   Years of education: Not on file   Highest education level: Not on file  Occupational History   Not on file  Tobacco Use   Smoking status: Former    Packs/day: 0.50    Years: 5.00    Additional pack years: 0.00    Total pack years: 2.50    Types: Cigarettes    Quit date: 03/27/2020    Years since quitting: 2.6   Smokeless tobacco: Never  Vaping Use   Vaping Use: Never used  Substance and Sexual Activity   Alcohol use: Yes    Alcohol/week: 5.0 standard drinks of alcohol    Types: 5 Glasses of wine per week   Drug use: Never   Sexual activity: Yes    Partners: Male    Birth control/protection: Condom    Comment: menarche 32yo, sexual debut  32yo  Other Topics Concern   Not on file  Social History Narrative   Not on file   Social Determinants of Health   Financial Resource Strain: Not on file  Food Insecurity: No Food Insecurity (04/26/2020)   Hunger Vital Sign    Worried About Running Out of Food in the Last Year: Never true    Ran Out of Food in the Last Year: Never true  Transportation Needs: Not on file  Physical Activity: Not on file  Stress: Not on file  Social Connections: Not on file  Intimate Partner Violence: Not on file    FAMILY HISTORY: Family History  Problem Relation Age of Onset   Hypertension Mother      ALLERGIES:  has No Known Allergies.  MEDICATIONS:  Current Outpatient Medications  Medication Sig Dispense Refill   acetaminophen (TYLENOL) 325 MG tablet Take 650 mg by mouth every 6 (six) hours as needed.     Calcium 200 MG TABS Calcium     cetirizine (ZYRTEC) 10 MG tablet 1 tablet Orally Once a day     Multiple Vitamin (M.V.I. ADULT IV) MVI     norethindrone-ethinyl estradiol-FE (LOESTRIN FE) 1-20 MG-MCG tablet Take 1 tablet by mouth daily. 84 tablet 3   No current facility-administered medications for this visit.    REVIEW OF SYSTEMS:    10 Point review of Systems was done is negative except as noted above.  PHYSICAL EXAMINATION: ECOG PERFORMANCE STATUS: 1 - Symptomatic but completely ambulatory  . Vitals:   11/18/22 1102  BP: (!) 140/100  Pulse: (!) 111  Resp: 20  Temp: 97.9 F (36.6 C)  SpO2: 100%   Filed Weights   11/18/22 1102  Weight: 194 lb 8 oz (88.2 kg)   .Body mass index is 29.57 kg/m.  GENERAL:alert, in no acute distress and comfortable SKIN: no acute rashes, no significant lesions EYES: conjunctiva are pink and non-injected, sclera anicteric OROPHARYNX: MMM, no exudates, no oropharyngeal erythema or ulceration NECK: supple, no JVD LYMPH:  no palpable lymphadenopathy in the cervical, axillary or inguinal regions LUNGS: clear to auscultation b/l with normal respiratory effort HEART: regular rate & rhythm ABDOMEN:  normoactive bowel sounds , non tender, not distended. No splenomegaly no hepatomegaly. Extremity: no pedal edema PSYCH: alert & oriented x 3 with fluent speech NEURO: no focal motor/sensory deficits  LABORATORY DATA:  I have reviewed the data as listed  .    Latest Ref Rng & Units 11/18/2022   11:43 AM 01/01/2021    4:01 AM 12/31/2020   11:56 AM  CBC  WBC 4.0 - 10.5 K/uL 3.4  11.9    Hemoglobin 12.0 - 15.0 g/dL 82.9  56.2  13.0   Hematocrit 36.0 - 46.0 % 37.4  35.6  39.4   Platelets 150 - 400 K/uL 281  310     .CBC     Component Value Date/Time   WBC 3.4 (L) 11/18/2022 1143   WBC 11.9 (H) 01/01/2021 0401   RBC 4.21 11/18/2022 1143   HGB 12.8 11/18/2022 1143   HCT 37.4 11/18/2022 1143   PLT 281 11/18/2022 1143   MCV 88.8 11/18/2022 1143   MCH 30.4 11/18/2022 1143   MCHC 34.2 11/18/2022 1143   RDW 14.0 11/18/2022 1143   LYMPHSABS 1.4 11/18/2022 1143   MONOABS 0.4 11/18/2022 1143   EOSABS 0.0 11/18/2022 1143   BASOSABS 0.0 11/18/2022 1143     .    Latest Ref Rng & Units 11/18/2022   11:43  AM 01/01/2021    4:01 AM 12/19/2020    1:45 PM  CMP  Glucose 70 - 99 mg/dL 89  161  75   BUN 6 - 20 mg/dL 12  9  11    Creatinine 0.44 - 1.00 mg/dL 0.96  0.45  4.09   Sodium 135 - 145 mmol/L 140  137  137   Potassium 3.5 - 5.1 mmol/L 4.3  3.9  3.8   Chloride 98 - 111 mmol/L 104  105  103   CO2 22 - 32 mmol/L 27  26  26    Calcium 8.9 - 10.3 mg/dL 9.7  8.9  8.7   Total Protein 6.5 - 8.1 g/dL 8.1  7.2  7.6   Total Bilirubin 0.3 - 1.2 mg/dL 0.6  0.6  0.7   Alkaline Phos 38 - 126 U/L 45  46  64   AST 15 - 41 U/L 40  27  30   ALT 0 - 44 U/L 38  29  34      RADIOGRAPHIC STUDIES: I have personally reviewed the radiological images as listed and agreed with the findings in the report. No results found.  ASSESSMENT & PLAN:   32 yo with h/o gastric sleeve surgery with   1) Leucopenia WBC count 3.4k with ANC 1.6k PLAN: -Discussed the reason for today's visit.  -educated patient the different etiologies of leucopenia.  -Discussed with the patient that her surgery could cause low WBC.  -we also discussed environmental allergies could cause variable WBC counts. -Discussed that the next step is to get labs today. Plan agrees with the plan.    . Orders Placed This Encounter  Procedures   CBC with Differential (Cancer Center Only)    Standing Status:   Future    Number of Occurrences:   1    Standing Expiration Date:   11/18/2023   CMP (Cancer Center only)    Standing Status:   Future    Number of  Occurrences:   1    Standing Expiration Date:   11/18/2023   Lactate dehydrogenase    Standing Status:   Future    Number of Occurrences:   1    Standing Expiration Date:   11/18/2023   Vitamin B12    Standing Status:   Future    Number of Occurrences:   1    Standing Expiration Date:   11/18/2023   Copper, serum    Standing Status:   Future    Number of Occurrences:   1    Standing Expiration Date:   11/18/2023   Hepatitis C antibody    Standing Status:   Future    Number of Occurrences:   1    Standing Expiration Date:   11/18/2023     FOLLOW-UP: Labs today Phone visit with Dr Candise Che in 1 week .The total time spent in the appointment was 45 minutes* .  All of the patient's questions were answered with apparent satisfaction. The patient knows to call the clinic with any problems, questions or concerns.   Wyvonnia Lora MD MS AAHIVMS [facility] Hematology/Oncology Physician [facility]  .*Total Encounter Time as defined by the Centers for Medicare and Medicaid Services includes, in addition to the face-to-face time of a patient visit (documented in the note above) non-face-to-face time: obtaining and reviewing outside history, ordering and reviewing medications, tests or procedures, care coordination (communications with other health care professionals or caregivers) and documentation in the medical record.  11/18/2022 9:16 AM  I, Ok Edwards, am acting as a Neurosurgeon for Wyvonnia Lora, MD. .I have reviewed the above documentation for accuracy and completeness, and I agree with the above. Johney Maine MD

## 2022-11-20 LAB — COPPER, SERUM: Copper: 116 ug/dL (ref 80–158)

## 2022-12-02 ENCOUNTER — Inpatient Hospital Stay (HOSPITAL_BASED_OUTPATIENT_CLINIC_OR_DEPARTMENT_OTHER): Payer: PRIVATE HEALTH INSURANCE | Admitting: Hematology

## 2022-12-02 DIAGNOSIS — D72819 Decreased white blood cell count, unspecified: Secondary | ICD-10-CM | POA: Diagnosis not present

## 2022-12-09 NOTE — Progress Notes (Signed)
HEMATOLOGY/ONCOLOGY PHONE VISIT NOTE  Date of Service: .12/02/2022  Patient Care Team: Delma Officer, PA as PCP - General (Internal Medicine)  CHIEF COMPLAINTS/PURPOSE OF CONSULTATION:  Phone visit to discuss lab results for workup of leukopenia  HISTORY OF PRESENTING ILLNESS:   Crystal Barton is a wonderful 32 y.o. female who has been referred to [place] by PA Eliane Decree for evaluation and management of Leukopenia.  Patient has been referred to [place] by her PCP for evaluation of persistent low WBC count for the last year. Patient has had bariatric surgery.   Patient notes she has been doing well overall for the past year. She notes she had gastric sleeve surgery around 2 years ago. She notes she has menstrual cycle bleeding and is currently taking birth control.   Patient currently takes multi-vitamin supplement.   Patient has seasonal allergies, which started around 2 years ago. She notes her allergy symptoms were worse this year. She takes OTC allergy medication.   Patient notes she has lost around 80-90 lbs since she had her gastric sleeve surgery. She used to take phentramine for weight management before her gastric sleeve surgery.   Patient previously had Hidradenitis suppurativa infection, which does not flare up after her gastric sleeve surgery.   She denies fever, chills, night sweats, skin rashes, unexpected weight loss, abdominal pain, chest pain, back pain, or leg swelling. She does complain of occasional mild left lower abdominal distention, but denies pain.   She denies any recent usage of antibiotics.    Patient notes his maternal grandmother had breast cancer. She also notes that her uncles did have cancer, but is not sure of the names. She denies any family history of blood disorder.   INTERVAL HISTORY  I connected with Netty Starring on 12/02/2022 at  3:30 PM EDT by telephone visit and verified that I am speaking with the correct person using two  identifiers.   I discussed the limitations, risks, security and privacy concerns of performing an evaluation and management service by telemedicine and the availability of in-person appointments. I also discussed with the patient that there may be a patient responsible charge related to this service. The patient expressed understanding and agreed to proceed.   Other persons participating in the visit and their role in the encounter: none   Patient's location: Home  Provider's location: [facility]   Chief Complaint: Follow-up for lab discussion for leukopenia evaluation.  Patient notes no acute new concerns since her last clinic visit with [place].  No infection issues.  No fevers no chills no night sweats no other acute new focal symptoms. Lab results were discussed in details.  MEDICAL HISTORY:  Past Medical History:  Diagnosis Date   Anxiety    Asthma    as a child   Hypertension    STD (sexually transmitted disease)    Chlamydia    SURGICAL HISTORY: Past Surgical History:  Procedure Laterality Date   LAPAROSCOPIC GASTRIC SLEEVE RESECTION N/A 12/31/2020   Procedure: LAPAROSCOPIC GASTRIC SLEEVE RESECTION, WITH HIATAL HERNIA REPAIR;  Surgeon: Kinsinger, De Blanch, MD;  Location: [facility];  Service: General;  Laterality: N/A;   SHOULDER SURGERY Right 07/2019   ligament repair   UPPER GI ENDOSCOPY N/A 12/31/2020   Procedure: UPPER GI ENDOSCOPY;  Surgeon: Sheliah Hatch De Blanch, MD;  Location: [facility];  Service: General;  Laterality: N/A;    SOCIAL HISTORY: Social History   Socioeconomic History   Marital status: Single    Spouse  name: Not on file   Number of children: Not on file   Years of education: Not on file   Highest education level: Not on file  Occupational History   Not on file  Tobacco Use   Smoking status: Former    Packs/day: 0.50    Years: 5.00    Additional pack years: 0.00    Total pack years: 2.50    Types: Cigarettes    Quit date: 03/27/2020    Years since  quitting: 2.7   Smokeless tobacco: Never  Vaping Use   Vaping Use: Never used  Substance and Sexual Activity   Alcohol use: Yes    Alcohol/week: 5.0 standard drinks of alcohol    Types: 5 Glasses of wine per week   Drug use: Never   Sexual activity: Yes    Partners: Male    Birth control/protection: Condom    Comment: menarche 32yo, sexual debut 32yo  Other Topics Concern   Not on file  Social History Narrative   Not on file   Social Determinants of Health   Financial Resource Strain: Not on file  Food Insecurity: No Food Insecurity (04/26/2020)   Hunger Vital Sign    Worried About Running Out of Food in the Last Year: Never true    Ran Out of Food in the Last Year: Never true  Transportation Needs: Not on file  Physical Activity: Not on file  Stress: Not on file  Social Connections: Not on file  Intimate Partner Violence: Not on file    FAMILY HISTORY: Family History  Problem Relation Age of Onset   Hypertension Mother     ALLERGIES:  has No Known Allergies.  MEDICATIONS:  Current Outpatient Medications  Medication Sig Dispense Refill   acetaminophen (TYLENOL) 325 MG tablet Take 650 mg by mouth every 6 (six) hours as needed.     Calcium 200 MG TABS Calcium     cetirizine (ZYRTEC) 10 MG tablet 1 tablet Orally Once a day     Multiple Vitamin (M.V.I. ADULT IV) MVI     norethindrone-ethinyl estradiol-FE (LOESTRIN FE) 1-20 MG-MCG tablet Take 1 tablet by mouth daily. (Patient not taking: Reported on 11/18/2022) 84 tablet 3   No current facility-administered medications for this visit.    REVIEW OF SYSTEMS:    No fevers or signs of infection  PHYSICAL EXAMINATION: Telemedicine visit  LABORATORY DATA:  I have reviewed the data as listed .CBC    Component Value Date/Time   WBC 3.4 (L) 11/18/2022 1143   WBC 11.9 (H) 01/01/2021 0401   RBC 4.21 11/18/2022 1143   HGB 12.8 11/18/2022 1143   HCT 37.4 11/18/2022 1143   PLT 281 11/18/2022 1143   MCV 88.8 11/18/2022  1143   MCH 30.4 11/18/2022 1143   MCHC 34.2 11/18/2022 1143   RDW 14.0 11/18/2022 1143   LYMPHSABS 1.4 11/18/2022 1143   MONOABS 0.4 11/18/2022 1143   EOSABS 0.0 11/18/2022 1143   BASOSABS 0.0 11/18/2022 1143   .    Latest Ref Rng & Units 11/18/2022   11:43 AM 01/01/2021    4:01 AM 12/31/2020   11:56 AM  CBC  WBC 4.0 - 10.5 K/uL 3.4  11.9    Hemoglobin 12.0 - 15.0 g/dL 95.2  84.1  32.4   Hematocrit 36.0 - 46.0 % 37.4  35.6  39.4   Platelets 150 - 400 K/uL 281  310     .    Latest Ref Rng & Units 11/18/2022  11:43 AM 01/01/2021    4:01 AM 12/19/2020    1:45 PM  CMP  Glucose 70 - 99 mg/dL 89  604  75   BUN 6 - 20 mg/dL 12  9  11    Creatinine 0.44 - 1.00 mg/dL 5.40  9.81  1.91   Sodium 135 - 145 mmol/L 140  137  137   Potassium 3.5 - 5.1 mmol/L 4.3  3.9  3.8   Chloride 98 - 111 mmol/L 104  105  103   CO2 22 - 32 mmol/L 27  26  26    Calcium 8.9 - 10.3 mg/dL 9.7  8.9  8.7   Total Protein 6.5 - 8.1 g/dL 8.1  7.2  7.6   Total Bilirubin 0.3 - 1.2 mg/dL 0.6  0.6  0.7   Alkaline Phos 38 - 126 U/L 45  46  64   AST 15 - 41 U/L 40  27  30   ALT 0 - 44 U/L 38  29  34    Component     Latest Ref Rng 10/28/2021 11/18/2022  RPR     NON-REACTIVE  NON-REACTIVE    HIV     NON-REACTIVE  NON-REACTIVE    HCV Ab     NON REACTIVE   NON REACTIVE   Copper     80 - 158 ug/dL  478   Vitamin G95     180 - 914 pg/mL  303   LDH     98 - 192 U/L  84 (L)     Legend: (L) Low  RADIOGRAPHIC STUDIES: I have personally reviewed the radiological images as listed and agreed with the findings in the report. No results found.  ASSESSMENT & PLAN:   31 yo with h/o gastric sleeve surgery with   1) Mild leukopenia without neutropenia Total WBC count was 3.4k with an ANC of 1600. No associated anemia or thrombocytopenia. PLAN: -Her available lab results were discussed in detail with her. -Currently she has only mild leukopenia without neutropenia with normal hemoglobin and platelets. -LDH within  normal limits and suggests against any increased bone marrow activity. -No overt evidence of B12 or copper deficiency in the context of her history of bariatric surgery and the other B vitamin deficiencies could be possible. -She was recommended to continue her bariatric B complex and B12 replacements. -Previous HIV test negative and we currently did a hepatitis C testing which was also negative. -Previous normal WBC count suggests against benign ethnic leukopenia/neutropenia. -Her significant weight loss after bariatric surgery could potentially affect the distribution of her WBCs and reduce some level of inflammation that might have previously been present with her hidradenitis suppurativa and obesity. -We also discussed that environmental allergies can cause some variable WBC counts that could fluctuate based on allergy seasons. -No indication for additional invasive hematologic workup at this time with bone marrow biopsy. -Would recommend continue to monitor her WBC counts with her primary care physicians every 6 months and to reconsult [place] if any significant new cytopenias arise. -Patient is agreeable to this plan  FOLLOW-UP: Return to clinic with PCP  The total time spent in the appointment was 10 minutes*.  All of the patient's questions were answered with apparent satisfaction. The patient knows to call the clinic with any problems, questions or concerns.   Wyvonnia Lora MD MS AAHIVMS [facility] Hematology/Oncology Physician [facility]  .*Total Encounter Time as defined by the Centers for Medicare and Medicaid Services includes, in  addition to the face-to-face time of a patient visit (documented in the note above) non-face-to-face time: obtaining and reviewing outside history, ordering and reviewing medications, tests or procedures, care coordination (communications with other health care professionals or caregivers) and documentation in the medical record.

## 2023-07-28 ENCOUNTER — Encounter (HOSPITAL_COMMUNITY): Payer: Self-pay | Admitting: *Deleted

## 2023-07-31 ENCOUNTER — Encounter: Payer: Self-pay | Admitting: Nurse Practitioner

## 2023-08-04 ENCOUNTER — Encounter: Payer: Self-pay | Admitting: Nurse Practitioner

## 2023-08-04 ENCOUNTER — Ambulatory Visit: Payer: PRIVATE HEALTH INSURANCE | Admitting: Nurse Practitioner

## 2023-08-04 VITALS — BP 114/80 | HR 94

## 2023-08-04 DIAGNOSIS — N898 Other specified noninflammatory disorders of vagina: Secondary | ICD-10-CM

## 2023-08-04 DIAGNOSIS — N760 Acute vaginitis: Secondary | ICD-10-CM

## 2023-08-04 DIAGNOSIS — Z3009 Encounter for other general counseling and advice on contraception: Secondary | ICD-10-CM

## 2023-08-04 DIAGNOSIS — N939 Abnormal uterine and vaginal bleeding, unspecified: Secondary | ICD-10-CM | POA: Diagnosis not present

## 2023-08-04 DIAGNOSIS — Z30016 Encounter for initial prescription of transdermal patch hormonal contraceptive device: Secondary | ICD-10-CM

## 2023-08-04 DIAGNOSIS — B9689 Other specified bacterial agents as the cause of diseases classified elsewhere: Secondary | ICD-10-CM

## 2023-08-04 DIAGNOSIS — Z7251 High risk heterosexual behavior: Secondary | ICD-10-CM | POA: Diagnosis not present

## 2023-08-04 LAB — PREGNANCY, URINE: Preg Test, Ur: NEGATIVE

## 2023-08-04 LAB — WET PREP FOR TRICH, YEAST, CLUE

## 2023-08-04 MED ORDER — METRONIDAZOLE 0.75 % VA GEL: 1.0000 | Freq: Every day | VAGINAL | 0 refills | Status: AC

## 2023-08-04 MED ORDER — NORELGESTROMIN-ETH ESTRADIOL 150-35 MCG/24HR TD PTWK: 1.0000 | MEDICATED_PATCH | TRANSDERMAL | 0 refills | Status: AC

## 2023-08-04 NOTE — Progress Notes (Signed)
   Acute Office Visit  Subjective:    Patient ID: Crystal Barton, female    DOB: [birth year], 33 y.o.   MRN: [id number]   HPI 33 y.o. G1P0010 presents today for vaginal bleeding x 2 days last week. Flow was moderate. LMP 07/12/23. Reports vaginal odor with clear discharge prior to bleeding. Was on COCs but stopped because she was forgetting to take. Stopped COCs in July 2024. Has been having unprotected intercourse. Declines STD screening today. Reports Neg UPT at home last week.   Patient's last menstrual period was 07/12/2023 (approximate).    Review of Systems  Constitutional: Negative.   Genitourinary:  Positive for menstrual problem and vaginal bleeding. Negative for vaginal discharge and vaginal pain.       Objective:    Physical Exam Constitutional:      Appearance: Normal appearance.  Genitourinary:    General: Normal vulva.     Vagina: Vaginal discharge present. No erythema.     Cervix: Normal.     Comments: Odor present    BP 114/80   Pulse 94   LMP 07/12/2023 (Approximate)   SpO2 99%  Wt Readings from Last 3 Encounters:  11/18/22 194 lb 8 oz (88.2 kg)  11/04/22 194 lb (88 kg)  09/07/22 190 lb (86.2 kg)        Patient informed chaperone available to be present for breast and/or pelvic exam. Patient has requested no chaperone to be present. Patient has been advised what will be completed during breast and pelvic exam.   Wet prep + clue cells (+ odor)  UPT neg  Assessment & Plan:   Problem List Items Addressed This Visit   None Visit Diagnoses       Bacterial vaginosis    -  Primary   Relevant Medications   metroNIDAZOLE (METROGEL) 0.75 % vaginal gel     Unprotected sexual intercourse       Relevant Orders   Pregnancy, urine     Vaginal bleeding       Relevant Orders   Pregnancy, urine     Vaginal odor       Relevant Orders   WET PREP FOR TRICH, YEAST, CLUE     General counseling and advice on female contraception         Encounter for initial  prescription of transdermal patch hormonal contraceptive device       Relevant Medications   norelgestromin-ethinyl estradiol [employer]) 150-35 MCG/24HR transdermal patch      Plan: UPT negative. Contraceptive options were reviewed, including hormonal methods, both combination (pill, patch, vaginal ring) and progesterone-only (pill, Depo Provera and Nexplanon), intrauterine devices (Mirena, [place], [place], and [place]), Phexxi, barrier methods (condoms, diaphragm) and female/female sterilization. The mechanisms, risks, benefits and side effects of all methods were discussed. Wants to do patch. Will start with first day of next period.   Wet prep + clue cells - Flagyl 500 mg BID x 7 days.   Return if symptoms worsen or fail to improve.    [name] DNP, 9:38 AM 08/04/2023

## 2023-09-29 ENCOUNTER — Encounter (HOSPITAL_BASED_OUTPATIENT_CLINIC_OR_DEPARTMENT_OTHER): Payer: Self-pay

## 2023-09-29 ENCOUNTER — Other Ambulatory Visit: Payer: Self-pay

## 2023-09-29 ENCOUNTER — Emergency Department (HOSPITAL_BASED_OUTPATIENT_CLINIC_OR_DEPARTMENT_OTHER): Payer: PRIVATE HEALTH INSURANCE

## 2023-09-29 ENCOUNTER — Emergency Department (HOSPITAL_BASED_OUTPATIENT_CLINIC_OR_DEPARTMENT_OTHER)
Admission: EM | Admit: 2023-09-29 | Discharge: 2023-09-29 | Disposition: A | Discharge status: Home / Self Care | Payer: PRIVATE HEALTH INSURANCE | Attending: Emergency Medicine | Admitting: Emergency Medicine

## 2023-09-29 ENCOUNTER — Other Ambulatory Visit (HOSPITAL_BASED_OUTPATIENT_CLINIC_OR_DEPARTMENT_OTHER): Payer: Self-pay

## 2023-09-29 DIAGNOSIS — O039 Complete or unspecified spontaneous abortion without complication: Secondary | ICD-10-CM | POA: Insufficient documentation

## 2023-09-29 DIAGNOSIS — O021 Missed abortion: Secondary | ICD-10-CM

## 2023-09-29 DIAGNOSIS — O209 Hemorrhage in early pregnancy, unspecified: Secondary | ICD-10-CM | POA: Diagnosis present

## 2023-09-29 LAB — CBC
HCT: 31.4 % — ABNORMAL LOW (ref 36.0–46.0)
Hemoglobin: 10.7 g/dL — ABNORMAL LOW (ref 12.0–15.0)
MCH: 30.7 pg (ref 26.0–34.0)
MCHC: 34.1 g/dL (ref 30.0–36.0)
MCV: 90.2 fL (ref 80.0–100.0)
Platelets: 257 10*3/uL (ref 150–400)
RBC: 3.48 MIL/uL — ABNORMAL LOW (ref 3.87–5.11)
RDW: 11.9 % (ref 11.5–15.5)
WBC: 6.7 10*3/uL (ref 4.0–10.5)
nRBC: 0 % (ref 0.0–0.2)

## 2023-09-29 LAB — HCG, QUANTITATIVE, PREGNANCY: hCG, Beta Chain, Quant, S: 61430 m[IU]/mL — ABNORMAL HIGH (ref <5)

## 2023-09-29 LAB — URINALYSIS, ROUTINE W REFLEX MICROSCOPIC
Bilirubin Urine: NEGATIVE
Glucose, UA: NEGATIVE mg/dL
Ketones, ur: NEGATIVE mg/dL
Leukocytes,Ua: NEGATIVE
Nitrite: NEGATIVE
Protein, ur: 30 mg/dL — AB
Specific Gravity, Urine: 1.02 (ref 1.005–1.030)
pH: 6 (ref 5.0–8.0)

## 2023-09-29 LAB — WET PREP, GENITAL
Sperm: NONE SEEN
Trich, Wet Prep: NONE SEEN
WBC, Wet Prep HPF POC: <10 (ref <10)

## 2023-09-29 LAB — COMPREHENSIVE METABOLIC PANEL WITH GFR
ALT: 12 U/L (ref 0–44)
AST: 16 U/L (ref 15–41)
Albumin: 3.5 g/dL (ref 3.5–5.0)
Alkaline Phosphatase: 26 U/L — ABNORMAL LOW (ref 38–126)
Anion gap: 8 (ref 5–15)
BUN: 8 mg/dL (ref 6–20)
CO2: 23 mmol/L (ref 22–32)
Calcium: 8.9 mg/dL (ref 8.9–10.3)
Chloride: 106 mmol/L (ref 98–111)
Creatinine, Ser: 0.58 mg/dL (ref 0.44–1.00)
GFR, Estimated: >60 mL/min (ref >60)
Glucose, Bld: 84 mg/dL (ref 70–99)
Potassium: 3.5 mmol/L (ref 3.5–5.1)
Sodium: 137 mmol/L (ref 135–145)
Total Bilirubin: 0.6 mg/dL (ref 0.0–1.2)
Total Protein: 6.7 g/dL (ref 6.5–8.1)

## 2023-09-29 LAB — URINALYSIS, MICROSCOPIC (REFLEX)

## 2023-09-29 LAB — PREGNANCY, URINE: Preg Test, Ur: POSITIVE — AB

## 2023-09-29 MED ORDER — MISOPROSTOL 200 MCG PO TABS
800.0000 ug | ORAL_TABLET | ORAL | Status: AC
  Administered 2023-09-29: 800 ug via VAGINAL
  Filled 2023-09-29: qty 4

## 2023-09-29 MED ORDER — MISOPROSTOL 200 MCG PO TABS
800.0000 ug | ORAL_TABLET | Freq: Once | ORAL | 0 refills | Status: DC
  Filled 2023-09-29: qty 4, 1d supply, fill #0

## 2023-09-29 MED ORDER — MISOPROSTOL 200 MCG PO TABS: 800.0000 ug | ORAL_TABLET | ORAL | Status: DC

## 2023-09-29 NOTE — ED Notes (Addendum)
 Cytotec administered vaginally prior to discharge . Pt aware to repeat cytotec dose tomorrow if no bleeding. Reviewed discharge instructions and pt aware to follow up with ob. Pt states understanding. Ambulatory at discharge

## 2023-09-29 NOTE — Discharge Instructions (Signed)
 You were seen for your miscarriage in the emergency department.   At home, please take the cytotec we prescribed you tomorrow if you do not have any bleeding after the first dose we gave you.    Check your MyChart online for the results of any tests that had not resulted by the time you left the emergency department.   Follow-up with your obgyn next week.   Return immediately to the emergency department if you experience any of the following: soaking through more than 2 pads an hour for 2 hours, fainting, severe pain, fevers, or any other concerning symptoms.    Thank you for visiting our Emergency Department. It was a pleasure taking care of you today.

## 2023-09-29 NOTE — ED Triage Notes (Signed)
 Pt reports she is [redacted] weeks pregnant and had vaginal bleeding this morning. Blood was red at the time, but now brown. Reports abdominal cramps last night and this morning

## 2023-09-29 NOTE — ED Provider Notes (Signed)
 [place] [facility] AT [facility] Provider Note   CSN: [id number] Arrival date & time: 09/29/23  0758     History  Chief Complaint  Patient presents with   Vaginal Bleeding    Crystal Barton is a 33 y.o. female.  33 year old female G3P0 at 11 weeks and 4 days who presents emergency department with abdominal cramping and vaginal bleeding.  Patient reports that since yesterday she has had abdominal cramping.  Overnight had some vaginal bleeding and passage of a clot.  Says that now it is turned to dark brown discharge instead of red blood.  Had an elective abortion and miscarriage with her other 2 pregnancies.  Has had gastric sleeve surgery but no other abdominal surgeries.  Denies any fevers or chills, dysuria, frequency, or discharge otherwise.       Home Medications Prior to Admission medications   Medication Sig Start Date End Date Taking? Authorizing Provider  misoprostol (CYTOTEC) 200 MCG tablet Take 4 tablets (800 mcg total) by mouth once for 1 dose. 09/29/23 09/30/23 Yes Rondel Baton, MD  acetaminophen (TYLENOL) 325 MG tablet Take 650 mg by mouth every 6 (six) hours as needed.    [provider]  Calcium 200 MG TABS Calcium    [provider]  cetirizine (ZYRTEC) 10 MG tablet 1 tablet Orally Once a day    [provider]  Multiple Vitamin (M.V.I. ADULT IV) MVI    [provider]  norelgestromin-ethinyl estradiol [employer]) 150-35 MCG/24HR transdermal patch Place 1 patch onto the skin once a week. 08/04/23   [name], NP      Allergies    Patient has no known allergies.    Review of Systems   Review of Systems  Physical Exam Updated Vital Signs BP (!) 138/92   Pulse 74   Temp 98 F (36.7 C) (Oral)   Resp 18   Ht 5\' 8"  (1.727 m)   Wt 96.2 kg   LMP 07/10/2023   SpO2 99%   BMI 32.23 kg/m  Physical Exam Vitals and nursing note reviewed.  Constitutional:      General: She is not in acute  distress.    Appearance: She is well-developed.  HENT:     Head: Normocephalic and atraumatic.     Right Ear: External ear normal.     Left Ear: External ear normal.     Nose: Nose normal.  Eyes:     Extraocular Movements: Extraocular movements intact.     Conjunctiva/sclera: Conjunctivae normal.     Pupils: Pupils are equal, round, and reactive to light.  Abdominal:     General: Abdomen is flat. There is no distension.     Palpations: Abdomen is soft. There is no mass.     Tenderness: There is no abdominal tenderness. There is no guarding.  Skin:    General: Skin is warm and dry.  Neurological:     Mental Status: She is alert and oriented to person, place, and time. Mental status is at baseline.  Psychiatric:        Mood and Affect: Mood normal.     ED Results / Procedures / Treatments   Labs (all labs ordered are listed, but only abnormal results are displayed) Labs Reviewed  WET PREP, GENITAL - Abnormal; Notable for the following components:      Result Value   Yeast Wet Prep HPF POC PRESENT (*)    Clue Cells Wet Prep HPF POC PRESENT (*)  All other components within normal limits  PREGNANCY, URINE - Abnormal; Notable for the following components:   Preg Test, Ur POSITIVE (*)    All other components within normal limits  CBC - Abnormal; Notable for the following components:   RBC 3.48 (*)    Hemoglobin 10.7 (*)    HCT 31.4 (*)    All other components within normal limits  HCG, QUANTITATIVE, PREGNANCY - Abnormal; Notable for the following components:   hCG, Beta Chain, Quant, S 61,430 (*)    All other components within normal limits  COMPREHENSIVE METABOLIC PANEL WITH GFR - Abnormal; Notable for the following components:   Alkaline Phosphatase 26 (*)    All other components within normal limits  URINALYSIS, ROUTINE W REFLEX MICROSCOPIC - Abnormal; Notable for the following components:   APPearance CLOUDY (*)    Hgb urine dipstick LARGE (*)    Protein, ur 30 (*)     All other components within normal limits  URINALYSIS, MICROSCOPIC (REFLEX) - Abnormal; Notable for the following components:   Bacteria, UA FEW (*)    All other components within normal limits  GC/CHLAMYDIA PROBE AMP ([place]) NOT AT [facility]    EKG None  Radiology US OB Limited Result Date: 09/29/2023 CLINICAL DATA:  [id number] Vaginal bleeding during pregnancy [id number] EXAM: OBSTETRIC <14 WK ULTRASOUND TECHNIQUE: Transabdominal ultrasound was performed for evaluation of the gestation as well as the maternal uterus and adnexal regions. COMPARISON:  None Available. FINDINGS: Intrauterine gestational sac: Single Yolk sac:  Not Visualized. Embryo:  Visualized. Cardiac Activity: Not Visualized. CRL:   32.8 mm   10 w 1 d                  [place] EDC: 04/15/2024. Subchorionic hemorrhage:  None visualized. Maternal uterus/adnexae: Right ovary is visualized and appears within normal limits. Left ovary is not visualized. IMPRESSION: *Single intrauterine embryo without cardiac activity, suggesting embryonic demise. Electronically Signed   By: Jules Schick M.D.   On: 09/29/2023 11:03    Procedures .Pelvic exam  Date/Time: 09/29/2023 10:30 AM  Performed by: Rondel Baton, MD Authorized by: Rondel Baton, MD  Consent: Verbal consent obtained. Comments: Chaperoned by tech Bronson Ing.  External genitalia unremarkable. Nor rashes or lesions noted.  Speculum exam with dark brown blood.  Vaginal wall mucosa is unremarkable.  Cervix visualized and is unremarkable (closed in appearance without any protruding material).  Bimanual exam without cervical motion tenderness, adnexal tenderness or any masses appreciated.        Medications Ordered in ED Medications  misoprostol (CYTOTEC) tablet 800 mcg (800 mcg Vaginal Given 09/29/23 1205)    ED Course/ Medical Decision Making/ A&P Clinical Course as of 09/29/23 1245  Tue Sep 29, 2023  1144 Discussed with Dr. Chestine Spore from [facility].   Recommends misoprostol 800 mcg now and additional dose 6 to 24 hours later.  Will have the patient called by office about appointment next week. Can call if she doesn't have bleeding and they will give her an rx of another dose.  [RP]    Clinical Course User Index [RP] Rondel Baton, MD                                 Medical Decision Making Amount and/or Complexity of Data Reviewed Labs: ordered. Radiology: ordered.  Risk Prescription drug management.   ANOKHI SHANNON is a 33 y.o. female with  comorbidities that complicate the patient evaluation including G3P0 at 11 weeks and 4 days who presents emergency department with abdominal cramping and vaginal bleeding.   Initial Ddx:  Miscarriage, ectopic pregnancy, IUP, STI  MDM/Course:  Patient presents to the emergency department with vaginal bleeding in the setting of being [redacted] weeks pregnant.  On exam does have significant blood in the vaginal vault.  Cervix appears to be closed at this time.  Had an ultrasound which shows that the patient is approximately [redacted] weeks pregnant but does not have any cardiac activity.  Was concerning for possible missed abortion.  Patient was given 800 mcg of misoprostol vaginally.  Was discussed with OB/GYN who will follow-up with her in a week.  She was given a prescription for misoprostol to take tomorrow if she does not pass her miscarriage by then.  Counseled to call OB/GYN if that did not work.  RhoGAM not indicated at this time.  This patient presents to the ED for concern of complaints listed in HPI, this involves an extensive number of treatment options, and is a complaint that carries with it a high risk of complications and morbidity. Disposition including potential need for admission considered.   Dispo: DC Home. Return precautions discussed including, but not limited to, those listed in the AVS. Allowed pt time to ask questions which were answered fully prior to dc.  Records reviewed Outpatient  Clinic Notes The following labs were independently interpreted: Chemistry and show no acute abnormality I personally reviewed and interpreted cardiac monitoring: normal sinus rhythm  I personally reviewed and interpreted the pt's EKG: see above for interpretation  I have reviewed the patients home medications and made adjustments as needed Consults: OBGYN  Portions of this note were generated with Scientist, clinical (histocompatibility and immunogenetics). Dictation errors may occur despite best attempts at proofreading.     Final Clinical Impression(s) / ED Diagnoses Final diagnoses:  Miscarriage  Missed abortion    Rx / DC Orders ED Discharge Orders          Ordered    misoprostol (CYTOTEC) 200 MCG tablet   Once        09/29/23 1158              Ninetta Basket, MD 09/29/23 1245

## 2023-09-29 NOTE — ED Notes (Signed)
Urine specimen in lab 

## 2023-09-30 LAB — GC/CHLAMYDIA PROBE AMP (~~LOC~~) NOT AT ARMC
Chlamydia: NEGATIVE
Comment: NEGATIVE
Comment: NORMAL
Neisseria Gonorrhea: NEGATIVE

## 2023-10-12 ENCOUNTER — Other Ambulatory Visit (HOSPITAL_BASED_OUTPATIENT_CLINIC_OR_DEPARTMENT_OTHER): Payer: Self-pay

## 2023-11-05 ENCOUNTER — Ambulatory Visit: Payer: PRIVATE HEALTH INSURANCE | Admitting: Nurse Practitioner

## 2023-11-05 NOTE — Progress Notes (Deleted)
   Crystal Barton 03/12/1991 [id number]   History:  33 y.o. G1P0010 presents for annual exam. Patch for management of heavy, irregular periods. 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 Completed Gardasil series. 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 HTN noted in history but not on medication.   Gynecologic History Patient's last menstrual period was 07/10/2023.   Contraception/Family planning: Ortho-Evra patches weekly Sexually active: Yes  Health Maintenance Last Pap: 11/04/2022. Results were: Normal neg HPV, 3-year repeat Last mammogram: Not indicated Last colonoscopy: Not indicated Last Dexa: Not indicated   Past medical history, past surgical history, family history and social history were all reviewed and documented in the EPIC chart. Boyfriend. CNA at [employer].   ROS:  A ROS was performed and pertinent positives and negatives are included.  Exam:  There were no vitals filed for this visit.    There is no height or weight on file to calculate BMI.  General appearance:  Normal Thyroid:  Symmetrical, normal in size, without palpable masses or nodularity. Respiratory  Auscultation:  Clear without wheezing or rhonchi Cardiovascular  Auscultation:  Regular rate, without rubs, murmurs or gallops  Edema/varicosities:  Not grossly evident Abdominal  Soft,nontender, without masses, guarding or rebound.  Liver/spleen:  No organomegaly noted  Hernia:  None appreciated  Skin  Inspection:  Grossly normal Breasts: Examined lying and sitting.   Right: Without masses, retractions, nipple discharge or axillary adenopathy.   Left: Without masses, retractions, nipple discharge or axillary adenopathy. Pelvic: External genitalia:  no lesions              Urethra:  normal appearing urethra with no masses, tenderness or lesions              Bartholins and Skenes: normal                 Vagina: normal appearing vagina with normal color and discharge, no  lesions              Cervix: no lesions Bimanual Exam:  Uterus:  no masses or tenderness              Adnexa: no mass, fullness, tenderness              Rectovaginal: Deferred              Anus:  normal, no lesions  Patient informed chaperone available to be present for breast and pelvic exam. Patient has requested no chaperone to be present. Patient has been advised what will be completed during breast and pelvic exam.   Assessment/Plan:  33 y.o. G1P0010 for annual exam.   Well woman exam with routine gynecological exam - Education provided on SBEs, importance of preventative screenings, current guidelines, high calcium diet, regular exercise, and multivitamin daily.   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 Will repeat at 3-year interval per guidelines.    No follow-ups on file.    Andee Bamberger DNP, 8:36 AM 11/05/2023

## 2023-11-17 ENCOUNTER — Encounter: Payer: Self-pay | Admitting: Nurse Practitioner

## 2023-11-17 ENCOUNTER — Ambulatory Visit: Payer: PRIVATE HEALTH INSURANCE | Admitting: Nurse Practitioner

## 2023-11-17 VITALS — BP 122/70 | HR 105 | Temp 96.4°F

## 2023-11-17 DIAGNOSIS — Z789 Other specified health status: Secondary | ICD-10-CM

## 2023-11-17 NOTE — Progress Notes (Signed)
 [facility]  Subjective:  Patient ID: Crystal Barton, female    DOB: Oct 31, 1990  Age: 33 y.o. MRN: [id number]  CC: wellness exam   HPI Crystal Barton presents for wellness exam visit for insurance benefit.  Patient has a PCP: Patra Bonnet PA PMH significant for: gastric bypass Last labs per PCP were completed: a month ago.  Health Maintenance:  Colonoscopy: No family hx Mammogram: No family hx. Pap: 2025, normal    Smoker: former  Immunizations:  Flu- declines Tdap- up to date 2021   Lifestyle: Diet- no particular diet. Hx of gastric bypass, eats small meals.  Exercise- walks a lot daily, very active at work.      Past Medical History:  Diagnosis Date   Anxiety    Asthma    as a child   Hypertension    STD (sexually transmitted disease)    Chlamydia    Past Surgical History:  Procedure Laterality Date   LAPAROSCOPIC GASTRIC SLEEVE RESECTION N/A 12/31/2020   Procedure: LAPAROSCOPIC GASTRIC SLEEVE RESECTION, WITH HIATAL HERNIA REPAIR;  Surgeon: Kinsinger, Alphonso Aschoff, MD;  Location: [facility];  Service: General;  Laterality: N/A;   SHOULDER SURGERY Right 07/2019   ligament repair   UPPER GI ENDOSCOPY N/A 12/31/2020   Procedure: UPPER GI ENDOSCOPY;  Surgeon: Dorrie Gaudier Alphonso Aschoff, MD;  Location: [facility];  Service: General;  Laterality: N/A;    Outpatient Medications Prior to Visit  Medication Sig Dispense Refill   Ascorbic Acid (VITAMIN C GUMMIES PO) Take by mouth.     cetirizine (ZYRTEC) 10 MG tablet 1 tablet Orally Once a day     acetaminophen  (TYLENOL ) 325 MG tablet Take 650 mg by mouth every 6 (six) hours as needed.     norelgestromin -ethinyl estradiol  (XULANE) 150-35 MCG/24HR transdermal patch Place 1 patch onto the skin once a week. 9 patch 0   Calcium 200 MG TABS Calcium     misoprostol  (CYTOTEC ) 200 MCG tablet Take 4 tablets (800 mcg total) by mouth once for 1 dose. 4 tablet 0   Multiple Vitamin (M.V.I. ADULT IV) MVI     No  facility-administered medications prior to visit.    ROS Review of Systems  HENT:  Negative for hearing loss.   Eyes:  Negative for visual disturbance.  Respiratory:  Negative for shortness of breath.   Cardiovascular:  Negative for chest pain.  Gastrointestinal: Negative.   Musculoskeletal: Negative.   Neurological:  Negative for dizziness and headaches.  Psychiatric/Behavioral:  Negative for sleep disturbance.     Objective:  BP 122/70 (BP Location: Left Arm, Patient Position: Sitting, Cuff Size: Normal)   Pulse (!) 105   Temp (!) 96.4 F (35.8 C) (Temporal)   LMP 11/03/2023 (Approximate)   SpO2 100%   Breastfeeding No   Physical Exam Constitutional:      General: She is not in acute distress. HENT:     Head: Normocephalic.     Right Ear: Tympanic membrane, ear canal and external ear normal.     Left Ear: Tympanic membrane, ear canal and external ear normal.  Eyes:     Pupils: Pupils are equal, round, and reactive to light.  Cardiovascular:     Rate and Rhythm: Normal rate and regular rhythm.     Heart sounds: Normal heart sounds.  Pulmonary:     Effort: Pulmonary effort is normal.     Breath sounds: Normal breath sounds.  Musculoskeletal:        General: Normal range  of motion.     Right lower leg: No edema.     Left lower leg: No edema.  Skin:    General: Skin is warm.  Neurological:     General: No focal deficit present.     Mental Status: She is alert and oriented to person, place, and time.  Psychiatric:        Mood and Affect: Mood normal.        Behavior: Behavior normal.      Assessment & Plan:    Crystal Barton was seen today for wellness exam.  Diagnoses and all orders for this visit:  Participant in health and wellness plan Adult wellness physical was conducted today. Importance of diet and exercise were discussed in detail.  We reviewed immunizations and gave recommendations regarding current immunization needed for age.  Preventative health exams  are up to date  Patient was advised yearly wellness exam    No orders of the defined types were placed in this encounter.   No orders of the defined types were placed in this encounter.   Follow-up: as needed

## 2024-01-13 ENCOUNTER — Other Ambulatory Visit (HOSPITAL_BASED_OUTPATIENT_CLINIC_OR_DEPARTMENT_OTHER): Payer: Self-pay

## 2024-06-16 ENCOUNTER — Other Ambulatory Visit: Payer: Self-pay

## 2024-06-16 ENCOUNTER — Emergency Department (HOSPITAL_BASED_OUTPATIENT_CLINIC_OR_DEPARTMENT_OTHER)
Admission: EM | Admit: 2024-06-16 | Discharge: 2024-06-16 | Disposition: A | Discharge status: Home / Self Care | Attending: Emergency Medicine | Admitting: Emergency Medicine

## 2024-06-16 ENCOUNTER — Encounter (HOSPITAL_BASED_OUTPATIENT_CLINIC_OR_DEPARTMENT_OTHER): Payer: Self-pay

## 2024-06-16 DIAGNOSIS — Y92410 Unspecified street and highway as the place of occurrence of the external cause: Secondary | ICD-10-CM | POA: Diagnosis not present

## 2024-06-16 DIAGNOSIS — M545 Low back pain, unspecified: Secondary | ICD-10-CM | POA: Diagnosis not present

## 2024-06-16 DIAGNOSIS — O26891 Other specified pregnancy related conditions, first trimester: Secondary | ICD-10-CM | POA: Diagnosis present

## 2024-06-16 DIAGNOSIS — Z3A18 18 weeks gestation of pregnancy: Secondary | ICD-10-CM | POA: Diagnosis not present

## 2024-06-16 DIAGNOSIS — O99891 Other specified diseases and conditions complicating pregnancy: Secondary | ICD-10-CM | POA: Diagnosis not present
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
## 2024-06-16 NOTE — Discharge Instructions (Signed)
 While you are in the emergency room, you have an ultrasound done that showed normal fetal activity and heart rate.  Follow-up with your OB/GYN.  Go to the [facility] women's emergency room if you develop any vaginal bleeding or worsening pain, or noticed decreased fetal movement.
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
